# Patient Record
Sex: Female | Born: 1980 | Race: Black or African American | Hispanic: No | Marital: Single
Health system: Midwestern US, Community
[De-identification: ages and names within clinical notes are randomized; demographics above are authoritative.]

## PROBLEM LIST (undated history)

## (undated) ENCOUNTER — Inpatient Hospital Stay: Payer: Self-pay

## (undated) DIAGNOSIS — F319 Bipolar disorder, unspecified: Secondary | ICD-10-CM

## (undated) DIAGNOSIS — F23 Brief psychotic disorder: Secondary | ICD-10-CM

---

## 2012-06-21 ENCOUNTER — Inpatient Hospital Stay
Admit: 2012-06-21 | Discharge: 2012-06-26 | Disposition: A | Discharge status: Home Health | Payer: MEDICARE | Attending: Addiction Medicine | Admitting: Addiction Medicine

## 2012-06-21 DIAGNOSIS — F29 Unspecified psychosis not due to a substance or known physiological condition: Secondary | ICD-10-CM

## 2012-06-21 LAB — METABOLIC PANEL, COMPREHENSIVE
A-G Ratio: 0.9 — ABNORMAL LOW (ref 1.0–3.1)
ALT (SGPT): 58 U/L (ref 12.0–78.0)
AST (SGOT): 39 U/L — ABNORMAL HIGH (ref 15–37)
Albumin: 4.2 g/dL (ref 3.40–5.00)
Alk. phosphatase: 95 U/L (ref 50–136)
Anion gap: 19 mmol/L — ABNORMAL HIGH (ref 10–17)
BUN/Creatinine ratio: 16 (ref 6.0–20.0)
BUN: 11 MG/DL (ref 7–18)
Bilirubin, total: 0.7 MG/DL (ref 0.20–1.00)
CO2: 23 mmol/L (ref 21–32)
Calcium: 9.3 MG/DL (ref 8.5–10.1)
Chloride: 100 mmol/L (ref 98–107)
Creatinine: 0.7 MG/DL (ref 0.6–1.3)
GFR est AA: >60 mL/min/{1.73_m2} (ref >60)
GFR est non-AA: >60 mL/min/{1.73_m2} (ref >60)
Globulin: 4.7 g/dL
Glucose: 76 mg/dL (ref 74–106)
Potassium: 4.3 mmol/L (ref 3.50–5.10)
Protein, total: 8.9 g/dL — ABNORMAL HIGH (ref 6.40–8.20)
Sodium: 138 mmol/L (ref 136–145)

## 2012-06-21 LAB — CBC WITH AUTOMATED DIFF
ABS. BASOPHILS: 0 10*3/uL (ref 0.0–0.2)
ABS. EOSINOPHILS: 0.1 10*3/uL (ref 0.0–0.7)
ABS. LYMPHOCYTES: 2 10*3/uL (ref 1.2–3.4)
ABS. MONOCYTES: 0.7 10*3/uL — ABNORMAL LOW (ref 1.1–3.2)
ABS. NEUTROPHILS: 5.4 10*3/uL (ref 1.4–6.5)
BASOPHILS: 0 % (ref 0–2)
EOSINOPHILS: 2 % (ref 0–5)
HCT: 41 % (ref 34.0–42.2)
HGB: 14 g/dL (ref 11.8–14.2)
IMMATURE GRANULOCYTES: 0.4 % (ref 0.0–5.0)
LYMPHOCYTES: 24 % (ref 16–40)
MCH: 28.9 PG (ref 27–31)
MCHC: 34.1 g/dL (ref 32–36)
MCV: 84.7 FL (ref 81–99)
MONOCYTES: 8 % (ref 0–12)
MPV: 10 FL (ref 7.4–10.4)
NEUTROPHILS: 66 % (ref 40–70)
PLATELET: 270 10*3/uL (ref 140–450)
RBC: 4.84 M/uL (ref 4.0–5.2)
RDW: 14.7 % — ABNORMAL HIGH (ref 11.5–14.5)
WBC: 8.2 10*3/uL (ref 4.8–10.8)

## 2012-06-21 LAB — ETHYL ALCOHOL: ALCOHOL(ETHYL),SERUM: <3 MG/DL (ref <3.0)

## 2012-06-21 MED ADMIN — ziprasidone (GEODON) 20 mg/mL (final conc.) injection: INTRAMUSCULAR | @ 18:00:00 | NDC 00049392083

## 2012-06-21 MED ADMIN — sterile water (preservative free) injection: @ 18:00:00 | NDC 00409488710

## 2012-06-21 MED ADMIN — LORazepam (ATIVAN) 2 mg/mL injection: INTRAMUSCULAR | @ 18:00:00 | NDC 00641604425

## 2012-06-21 NOTE — ED Notes (Signed)
Assumed care of pt. Pt brought to ED after found yelling and walking around with her children barefoot. Pt is awake and alert. Pt not verbally responsive. Identity not known. Pt not cooperative with EDT to being processing. Pt standing making a moaning noise with hands in air, facing the wall. MD made aware.

## 2012-06-21 NOTE — ED Notes (Signed)
Pt took off all of her clothes in the bathroom and was standing in corner reading the bible. Pt would not speak or acknowledge RN. Consulting civil engineer and EDT assisted pt to re-dress and back to bed. Psych SW at bedside.

## 2012-06-21 NOTE — ED Notes (Signed)
RN requested urine sample from pt. Pt ambulated to restroom and provided sample. Pt to be admitted. Awaiting bed assignment.

## 2012-06-21 NOTE — ED Notes (Signed)
Pt sleeping at this time. Sitter at bedside. Will continue to monitor.

## 2012-06-21 NOTE — ED Notes (Signed)
Brought in by police,EP was yelling  And walking barefooted with her 3 children.Refused to answer questions. Talking to self and drooling.

## 2012-06-21 NOTE — Other (Signed)
TRANSFER - IN REPORT:    Verbal report received from [facility], RN(name) on [employer]  being received from ED(unit) for routine progression of care      Report consisted of patient???s Situation, Background, Assessment and   Recommendations(SBAR).     Information from the following report(s) SBAR, Kardex, ED Summary, [facility] and Recent Results was reviewed with the receiving nurse.    Opportunity for questions and clarification was provided.      Assessment completed upon patient???s arrival to unit and care assumed.

## 2012-06-21 NOTE — Other (Signed)
TRANSFER - OUT REPORT:    Verbal report given to Ethel(name) on Heather Huffman  being transferred to [facility] (unit) for routine progression of care       Report consisted of patient???s Situation, Background, Assessment and   Recommendations(SBAR).     Information from the following report(s) SBAR, ED Summary, [facility] and Recent Results was reviewed with the receiving nurse.    Opportunity for questions and clarification was provided.

## 2012-06-21 NOTE — ED Notes (Signed)
Pt began crying when MD and security undressed pt. Pt cooperative after being medicated. Awaiting lab results. Will continue to monitor.

## 2012-06-21 NOTE — ED Provider Notes (Signed)
Patient was seen by Psych Child psychotherapist. She was able to give her name and her children's name but refuse to answer other questions. She will be admitted for psychiatric stabilization. Involuntary papers signed.  Johnna Acosta, MD

## 2012-06-21 NOTE — ED Notes (Signed)
Pt sleeping. Pt arousable to touch. Pt still will not give name.

## 2012-06-21 NOTE — Progress Notes (Signed)
Curt Bears. with Value options approved patient to be admitted for 5 days-- 4/23, 4/24,4/25,4/26,4/27 with review on 4/28.  Auth# [id number]

## 2012-06-21 NOTE — Progress Notes (Signed)
Pt is a 32 year old single african [place] female who was brought to the ER by [facility] on an emergency petition. Reportedly patient was found walking in and out of traffic without shoes with her three young children, ages 69,7, and 3. Since in the er, patient has basically been uncooperative and refusing to answer any questions.Pt had been listed as a Stage manager.    Reportedly,while  being processed pt stood with her hands raised facing a wall and talking to her self and moaning.  Also, pt found in the bathroom naked standing in the corner reading her bible.  Initially, pt refused to talk to social worker, but instead sat mumbling and turned head.  Later, pt asked to speak to social worker asking if I knew where her children are.  Patient informed that I need to know her name and the name and ages of the children in order to attempt to locate them.  Patient gave the names of her children as being [place] Puth-9 yrs old, Lazaro Arms- 30 years old, and Enrigue Catena -30 years old.  Pt refused any further discussion and turned and started reading her Bible. Social worker unable to assess S/I,HI as sits with a blank stare. Pt does appear to be responding to internal stimuli as she is not focusing, mumbling to her self.      Axis I: Psychotic D/O Nos  Axis II: deferred  Axis III: Unknown  Axis IV: Unknown  Axis V:20    Patient discussed with Dr Freida Busman who recommended an involuntary inpatient psychiatric admission

## 2012-06-21 NOTE — ED Notes (Signed)
Pt requesting PO fluids. Pt ambulated to restroom and gave urine sample without distress. Pt did not respond when RN requested her name. Pt asked if she could go home after she gave urine, RN advised she needs to be evaluated by psych social worker.

## 2012-06-21 NOTE — ED Notes (Signed)
Sitter advises pt sat up on bed and urinated on herself. Sitter changed sheets.

## 2012-06-21 NOTE — ED Provider Notes (Signed)
HPI Comments: The patient was an emergency petition. The pt was walking in the street barefoot with two small children. She was walking into traffic and her behaviour was not appropriate.  She would not answer questions and she was brought to the hospital by police for further evaluation.    Patient is a 32 y.o. female presenting with mental health disorder. The history is provided by the police. The history is limited by the condition of the patient.   Mental Health Problem   Associated symptoms include unresponsiveness. Pertinent negatives include no confusion.     Past medical history - unable to obtain    Medications - unable to obtain    Social history- The pt was not willing to give a social history    History reviewed. No pertinent past medical history.     History reviewed. No pertinent past surgical history.      History reviewed. No pertinent family history.     History     Social History   ??? Marital Status: SINGLE     Spouse Name: N/A     Number of Children: N/A   ??? Years of Education: N/A     Occupational History   ??? Not on file.     Social History Main Topics   ??? Smoking status: Unknown If Ever Smoked   ??? Smokeless tobacco: Never Used   ??? Alcohol Use: Not on file   ??? Drug Use: Not on file   ??? Sexually Active: Not on file     Other Topics Concern   ??? Not on file     Social History Narrative   ??? No narrative on file                  ALLERGIES: Review of patient's allergies indicates not on file.      Review of Systems   Unable to perform ROS: Psychiatric disorder   Psychiatric/Behavioral: Negative for confusion.       Filed Vitals:    06/21/12 1337 06/21/12 1341   BP: 115/89    Pulse: 89    Temp: 97 ??F (36.1 ??C)    Resp: 20    Height:  5\' 4"  (1.626 m)   Weight:  72.576 kg (160 lb)   SpO2: 99%             Physical Exam   Nursing note and vitals reviewed.  Constitutional: She appears well-developed and well-nourished.   HENT:   Head: Normocephalic and atraumatic.   Eyes: Conjunctivae are normal. Pupils are  equal, round, and reactive to light.   Neck: Normal range of motion. Neck supple.   Cardiovascular: Normal rate and regular rhythm.    Pulmonary/Chest: Effort normal and breath sounds normal.   Abdominal: Soft. Bowel sounds are normal.   Neurological: She is alert.   Pt selectively talking.   She requested something to drink and that she did not want to take her shirt off.  Unable to get her name or orientation.  Pt moaning.   Skin: Skin is warm and dry.   Psychiatric:   Pt moaning not answering questions.   Pt  had to be sedated for agitated behaviior        MDM     Differential Diagnosis; Clinical Impression; Plan:     A young appearing female with bizaare behavior that is most likely related to a psychiatric disorder      Plan:     Labs  Psych  evaluation              Procedures    4:35 PM  The pt is arousable but very sleepy at this time.

## 2012-06-22 LAB — URINALYSIS W/ RFLX MICROSCOPIC
Bilirubin: NEGATIVE
Glucose: NEGATIVE mg/dL
Ketone: 15 mg/dL — AB
Leukocyte Esterase: NEGATIVE
Nitrites: NEGATIVE
Protein: NEGATIVE mg/dL
Specific gravity: 1.01 (ref 1.003–1.030)
Urobilinogen: 0.2 EU/dL (ref 0.2–1.0)
pH (UA): 6 (ref 5.0–7.0)

## 2012-06-22 LAB — DRUG SCREEN, URINE
AMPHETAMINES: NEGATIVE
BARBITURATES: NEGATIVE
BENZODIAZEPINES: NEGATIVE
COCAINE: NEGATIVE
METHADONE: NEGATIVE
OPIATES: NEGATIVE
PCP(PHENCYCLIDINE): NEGATIVE
THC (TH-CANNABINOL): NEGATIVE
TRICYCLICS: NEGATIVE

## 2012-06-22 LAB — URINE MICROSCOPIC
Casts: NONE SEEN /lpf
Crystals, urine: NONE SEEN /LPF
RBC: 0 /hpf
WBC: 0 /hpf

## 2012-06-22 MED ADMIN — diphenhydrAMINE (BENADRYL) injection 50 mg: INTRAMUSCULAR | @ 08:00:00 | NDC 63323066401

## 2012-06-22 MED ADMIN — ziprasidone (GEODON) 20 mg in sterile water (preservative free) injection: INTRAMUSCULAR | @ 08:00:00 | NDC 00409488710

## 2012-06-22 NOTE — H&P (Signed)
INFORMATION SOURCE: Chart review, patient interview.    CHIEF COMPLAINT: "I'm okay, it's all right."    HISTORY OF PRESENT ILLNESS: The patient is a 32 year old single African  American female who presented to [facility] emergency department  on emergency petition, transported by [facility] police. Reportedly,  the patient was found walking in and out of traffic without shoes,  accompanied by her 3 childrens, ages 70, 64 and 3. While in the emergency  department, the patient was basically uncooperative and refusing to answer  questions. In the emergency department, the patient was listed as Heather Huffman.  Reportedly while being processed, the patient stood with her hands raised,  facing a wall and talking to herself and moaning. The patient was also  found in a bathroom naked, standing in the corner, reading from a Bible.  Initially the patient refused or was unwilling to talk to the ED social  worker, but instead sat mumbling and turned her head. Later, the patient  asked to speak to social worker, asking if she knew where her children  were. The patient gave the names of her children and ages. The patient  refused to have any further discussion and returned to her Bible. Social  worker was unable to assess the patient for suicidal or homicidal  ideations. The patient proceeded with a blank stare. The patient appeared  to be responding to internal stimuli, as she did not focus and was mumbling  to herself. At this writer's attempt to interview the patient, the patient  was able to communicate. 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 She said that she was  worried but repeated feeling fine and needed some rest. The patient denied  not seem concerned about the location of her children, and had difficulty  giving the names of relatives, their phone numbers or addresses in order  to  secure collateral information. When questioned, the patient denied suicidal  or homicidal ideations, and the patient denied hearing voices.    PAST PSYCHIATRIC HISTORY: The patient was reported a self diagnosis of  depression which started at age 41. Inpatient psychiatric stays at  [facility], "I don't remember when." No current  outpatient provider.    PAST MEDICAL HISTORY: The patient denies. Last physical in 2013.    ALLERGIES: AMOXICILLIN.    MEDICATIONS: None current.    FAMILY PSYCHIATRIC HISTORY: The patient reports depression.    SOCIAL HISTORY: The patient resides in house with 3 children, ages 23, 61 and  3. They are currently in the custody of child protective services. The  patient has a 12th grade education.    MENTAL STATUS EXAMINATION: On admission, the patient's appearance is  unkempt, tense, and rigid. The patient's behavior is agitated, guarded,  poor eye contact, and occasionally restless. The patient is not  forthcoming. The patient is oriented to time, place and person and  situation. The patient's speech is impoverished, slow, and monotone. The  patient's mood is withdrawn, sad and frightened. The patient's range of  affect is constricted and inappropriate. The patient's thought content is  paranoid. Thought process is blocking. Thought perception appears  responding to internal stimuli. The patient's memory is significantly  impaired, and/or selective. The patient's judgment and insight are poor.    ASSESSMENT  Axis I: Psychotic disorder, not otherwise specified; depressive disorder,  not otherwise specified.  Axis II: Deferred.  Axis III: None acute.  Axis IV: Child custody issues, questionable housing issues, questionable  legal issues.  Axis V: 35.    PLAN: Admit to [facility]. Obtain collateral information. Continue to  monitor psychotic and affective symptoms. Somatic evaluation. Constant  observation. Continue to develop rapport. Consider antipsychotic trial,   with discussion of risk/benefit profile and potential side effects profile.  Begin disposition planning.        Reather Littler MD        Dictated by: Mallory Shirk Shevelle Smither]  Job#: [[id number]]  DD: [06/22/2012 08:24 P]  DT: [06/22/2012 08:40 P]  Transcribed by: [wmx]    cc:            Saumya Hukill,MD Freida Busman

## 2012-06-22 NOTE — Behavioral Health Treatment Team (Signed)
Patient currently resting quietly in quiet room. PRN medication effective. Will continue to monitor.

## 2012-06-22 NOTE — Progress Notes (Signed)
Pt in  quiet room this morning, laying on mattress.  Selectively mute, no response to  Attempted engagement

## 2012-06-22 NOTE — Behavioral Health Treatment Team (Signed)
Patient overheard crying and moaning loudly in bedroom. Upon getting to patient's room, this nurse found patient standing naked in bedroom, facing the wall, with both hands raised. Despite much encouragement, nurse was unable to verbally redirect or calm patient down. Patient was medicated with PRN Geodon and Benadryl as ordered. Patient continues to be selectively mute. Will continue to monitor.

## 2012-06-22 NOTE — Other (Signed)
Recorded shift change report given  Hilary Ottley, RN   (offgoing nurse).  Report given with SBAR.

## 2012-06-22 NOTE — Behavioral Health Treatment Team (Signed)
Pt observed in bedroom sitting up in bed with no clothes on. Pt reading bible and crying. Pt is difficult to engage as pt continues to be selectively mute. Pt appears to be thought blocking and frequently looks at nurse with a blank stare saying nothing. Pt only states "I am praying, there is nothing else going on." Dr Freida Busman made aware. Will continue to monitor as needed.

## 2012-06-22 NOTE — Progress Notes (Signed)
"  i'm ok" was the response of the client when asked if she remembered why she was here.  Clients' affect is blunted, mood is constricted. Appears to responding to internal stimuli. Also exhibiting some thought blocking.  Pt is also preoccupied on "do you have an extra food tray? Patient stated that she just wanted to get into her bed.  She has been visible in the dayroom watching TV. Withdrawn to self.  Interacting with staff. No peer interaction noted.  Pt showered and erfromed adl's, independently.  Will continue to monitor for psychosis,agitation ,bizare behavior.

## 2012-06-22 NOTE — Other (Signed)
Recorded shift change report given by ETHEL  NKWANYUO   (offgoing nurse).  Report given with SBAR, Kardex and MAR.

## 2012-06-22 NOTE — H&P (Signed)
dictated

## 2012-06-23 MED ADMIN — diphenhydrAMINE (BENADRYL) injection 50 mg: INTRAMUSCULAR | @ 15:00:00 | NDC 63323066401

## 2012-06-23 MED ADMIN — ziprasidone (GEODON) 20 mg in sterile water (preservative free) injection: INTRAMUSCULAR | @ 15:00:00 | NDC 00409488710

## 2012-06-23 NOTE — Progress Notes (Signed)
06/22/12 1950   Attitude and Behavior   General Attitude Guarded   Affect Constricted   Mood Anxious   Insight Poor   Judgement Impaired   Memory  Intact   Thought Content Preoccupation   Hallucinations None   Delusions Paranoid   Concentration Poor   Speech Pattern Unremarkable   Thought Process Thought blocking   Motor Activity Unremarkable   Pt was met in her bedroom sitting up in bed when care was first assumed. Pt was guarded with information when nurse was trying to establish rapport. Pt was preoccupied with taking a shower as she asked nurse numerous times after been told shower time was between 9:30- 10pm. Pt was withdrawn to self and bedroom. She's weird and strange in behavior. She stares at staff periodically and when checked upon at 4:00 a.m., she has about 50 paper towels used by her bedside floor. She claimed she used them to wipe her hands. Pt denies SI/HI/any hallucinations.

## 2012-06-23 NOTE — Progress Notes (Addendum)
.  Client in room with door closed. Upon opening the door, this nurse observed piles of paper towel on top of the nightstand.  A sheet at the foot of the door and  the client standing up staring at the door.  Pt  appeared  to be responding to Internal stimuli.( turning head to one side occasionally and grimacing).  Pt Increasingly became agitated; impulsively jumping and moving as if she is startled when this nurse moved closer to her.  Despite verbal attempts to assure patient of safety patient remained paranoid and increasingly agitated. Pt was given geodon 20mg  and benadryl 50mg  as ordered. Given in left detoid Im. No hands on necessary, no injury oted.

## 2012-06-23 NOTE — Consults (Signed)
DATE OF CONSULTATION: 06/23/2012    REQUESTING PHYSICIAN: Reather Littler, MD    REASON FOR CONSULTATION: Medical clearance.    HISTORY OF PRESENT ILLNESS: This is a 32 year old female who was brought  into the emergency room by [facility] on an emergency petition.  The patient was found walking in and out of traffic without shoes with her  3 young children, aged 43, 7, and 3, and admitted to the psychiatric unit  for psychosis. The patient was seen and examined. She is awake, alert,  oriented, cooperative. She states that she feels fine right now. She stated  that she has history of bipolar disorder and had been taking some  medication in the past, however, does not remember her medication. She  stated that she follows at [facility]. She has history of asthma and  has been taking albuterol inhaler in the past. Denies any cough, shortness  of breath, wheezing or chest tightness at this point. No fever or chills.  No nausea or vomiting. No urinary or bowel complaint. No chest pain or  palpitation.    PAST MEDICAL HISTORY:  1. Bipolar disorder.  2. Psychosis.  3. Asthma.    ALLERGIES: NO KNOWN DRUG ALLERGIES.    CURRENT MEDICATIONS: Include  1. Benadryl 50 mg every 4 hours as needed.  2. Trazodone 50 mg at bedtime.  3. Geodon 20 mg every 4 hours as needed.    FAMILY HISTORY: Noncontributory.    SOCIAL HISTORY: The patient is single. She has 3 children. Denies use of  cigarettes, alcohol, or illicit drug use. Currently unemployed.    REVIEW OF SYSTEMS: All 10 systems are reviewed as per HPI, otherwise  negative.    PHYSICAL EXAMINATION  GENERAL: She is lying in bed in no sign of distress.  VITAL SIGNS: Blood pressure 119/81, heart rate of 110, respiratory rate 18,  temperature of 99.0, pulse oximetry 100%.  HEENT: Head is atraumatic, normocephalic. Ears: No discharge or ulceration.  Eyes: Pupils are equal, round, reactive to light and accommodation. Nose:  Septum not deviated, no lesion. Oropharynx is  clear. No lesion.  NECK: Supple. No thyroid enlargement.  RESPIRATORY: Bilateral air entry. No additional sound.  CARDIOVASCULAR: S1 and S2 were heard.  GASTROINTESTINAL: Abdomen is soft, no mass, tenderness or organomegaly.  Active bowel sounds.  MUSCULOSKELETAL: No joint deformity, swelling or tenderness.  SKIN: Intact, no lesion.  NEUROLOGIC: She is awake, alert, oriented x3. No sensory, motor or reflex  abnormalities.    LABORATORY DATA: Sodium 138, potassium 4.3, chloride 100, bicarbonate 23,  anion gap of 19, glucose 26, BUN of 11, creatinine 0.70, calcium 9.3. WBC  count 8.3, hemoglobin 14.0, hematocrit 41.0, platelets 270. Urinalysis is  unremarkable. Urine toxicology is negative.    ASSESSMENT:  1. Bizarre behavior, rule out psychosis.  2. Bipolar disorder.  3. Asthma.    PLAN: With regard to her psychiatric conditions including bipolar disorder,  psychosis and bizzare behavior, she is being managed by psychiatric team  and currently she is on Geodon 20 mg every 4 hours as needed, Benadryl 50  mg every 4 hours as needed, and trazodone 50 mg by mouth at bedtime. She  has history of asthma and her asthma is stable. In the meantime, I will  start patient with albuterol inhaler 2 puffs every 6 hours as needed.        Electronically viewed and signed by Illene Labrador, MD on 06/23/2012 07:34  P.  Dictated byIllene Labrador  Job#: [[id number]]  DD: [06/23/2012 10:44 A]  DT: [06/23/2012 11:56 A]  Transcribed by: [wmx]    cc:            Rhonda,MD Cathie Hoops

## 2012-06-23 NOTE — Progress Notes (Signed)
PSYCHIATRY INPATIENT PROGRESS NOTE    SUBJECTIVE: Pt presents in hospital attire . She is mildly dishelved and obese.  She makes poor eye contact and is soft spoken.  She ins soft spoken and speech is improvished.  She appears RIS.  Her eye contact is poor.  She at times appears despondent.  When questioned about where her children are or any support contacts she is evasive and offers no information or affective change.  Pt denies any pain or discomfort.  She did ask to be discharge on Tuesday ,but gave no discharge location.  I prompted her with a trial of medication to clear her thinking and she refused.  Pt denies SI, HI or AH.    Current facility-administered medications:albuterol (PROVENTIL VENTOLIN) nebulizer solution 2.5 mg, 2.5 mg, Nebulization, Q6H PRN, Reather Littler, MD;  ziprasidone (GEODON) 20 mg in sterile water (preservative free) injection, 20 mg, IntraMUSCular, Q4H PRN, Reather Littler, MD, 20 mg at 06/22/12 0413;  diphenhydrAMINE (BENADRYL) injection 50 mg, 50 mg, IntraMUSCular, Q4H PRN, Reather Littler, MD, 50 mg at 06/22/12 0413  traZODone (DESYREL) tablet 50 mg, 50 mg, Oral, QHS PRN, Reather Littler, MD      No Known Allergies      Mental Status Exam  The patient's appearance is unkempt.  The patient's behavior shows retardation and shows poor eye contact. The patient is oriented to time, place, person and situation.  The patient's speech is slowed, is soft and is impoverished.  The patient's mood  is depressed, is withdrawn, is sad and is frightened.  The range of affect is constricted.  The patient's thought content  demonstrates paranoia.  The thought process is blocking.  The patient's perception demonstrated changes in the following:  Auditory, appears RIS. The patient's memory is impaired.  The patient's appetite shows no evidence of impairment.  The patient's sleep shows no evidence of impairment. The patient shows no insight.  The patient's judgement is psychologically impaired and is cognitively  impaired.      Labs reviewed.    Blood pressure 126/86, pulse 96, temperature 98.7 ??F (37.1 ??C), resp. rate 19, height 5\' 4"  (1.626 m), weight 100 kg (220 lb 7.4 oz), last menstrual period 05/22/2012, SpO2 100.00%.    IMPRESSION AND PLAN:   Active Hospital Problems    Diagnosis Date Noted   ??? Psychosis 06/22/2012       1. Continue inpatient psychiatric hospitalization on routine precautions.  2. Pt refuses med trial, continue to build rapport.  3. Continue discharge planning. Obtain collateral info from CPS    Reather Littler, MD   06/23/2012 8:27 PM

## 2012-06-23 NOTE — Progress Notes (Signed)
PRN effective. Patient is calm, speaking and responding to writer verbally from time to time. Pt remained in room for the early part of the afternoon.visible for meals only., no peer interaction. Will continue to monitor and provide comfort. Q 15 minute safety checks continues.

## 2012-06-23 NOTE — Other (Signed)
Recorded shift change report given by MOJISOLA A. SOFOLUKE   (offgoing nurse).  Report given with SBAR, Kardex and MAR.

## 2012-06-23 NOTE — Other (Signed)
Patient did not participate in community meeting.In room standing appears to be preoccupied.

## 2012-06-24 MED ADMIN — diphenhydrAMINE (BENADRYL) injection 50 mg: INTRAMUSCULAR | @ 22:00:00 | NDC 63323066401

## 2012-06-24 MED ADMIN — ziprasidone (GEODON) 20 mg in sterile water (preservative free) injection: INTRAMUSCULAR | @ 14:00:00 | NDC 00409488710

## 2012-06-24 MED ADMIN — diphenhydrAMINE (BENADRYL) injection 50 mg: INTRAMUSCULAR | @ 14:00:00 | NDC 63323066401

## 2012-06-24 MED ADMIN — traZODone (DESYREL) tablet 50 mg: ORAL | @ 03:00:00 | NDC 00904399061

## 2012-06-24 MED ADMIN — traZODone (DESYREL) tablet 50 mg: ORAL | @ 04:00:00 | NDC 00904399061

## 2012-06-24 MED ADMIN — ziprasidone (GEODON) 20 mg in sterile water (preservative free) injection: INTRAMUSCULAR | @ 22:00:00 | NDC 00409488710

## 2012-06-24 NOTE — Progress Notes (Signed)
PSYCHIATRY INPATIENT PROGRESS NOTE    SUBJECTIVE:Pt presents pacing hallway holding bible and malodorous. Pt remains eager to leave.  When question what she will do upon discharge she turns to read her bible.  When not reading Pt appears RIS.  She refuses any med trials. Pt reports sleep, appetite and energy and good. " I am doing good in Jesus name".  She says she will locate her children and does not need any help, when asked where she will start, her reply is, " from the beginning".        Current facility-administered medications:albuterol (PROVENTIL VENTOLIN) nebulizer solution 2.5 mg, 2.5 mg, Nebulization, Q6H PRN, Reather Littler, MD;  ziprasidone (GEODON) 20 mg in sterile water (preservative free) injection, 20 mg, IntraMUSCular, Q4H PRN, Reather Littler, MD, 20 mg at 06/23/12 1036;  diphenhydrAMINE (BENADRYL) injection 50 mg, 50 mg, IntraMUSCular, Q4H PRN, Reather Littler, MD, 50 mg at 06/23/12 1035  traZODone (DESYREL) tablet 50 mg, 50 mg, Oral, QHS PRN, Reather Littler, MD, 50 mg at 06/24/12 0017      No Known Allergies      Mental Status Exam  The patient's appearance is unkempt, shows poor hygiene, is bizarre and is tense.  The patient's behavior is agitated, show psychomotor dysfunction, shows poor eye contact and is restless. The patient is only aware of  time, place and person.  The patient's speech is soft and is impoverished.  The patient's mood  is depressed, is withdrawn, is sad and is frightened.  The range of affect is constricted and is innappropriate.  The patient's thought content  demonstrates delusions and paranoia.  The thought process is blocking.  The patient's perception demonstrated changes in the following:  auditory  Hallucinations, RIS. The patient's memory is impaired.  The patient's appetite shows no evidence of impairment.  The patient's sleep shows no evidence of impairment. The patient shows no insight.  The patient's judgement is psychologically impaired.      Labs reviewed.     IMPRESSION AND PLAN:   1. Continue inpatient psychiatric hospitalization on routine precautions. continue to build rapport.  2. Continue discharge planning.     Reather Littler, MD   06/24/2012 9:36 AM

## 2012-06-24 NOTE — Other (Signed)
Patient medicated with Geodon 20 mg,IM at 1748 and Diphenhydramine 50 mg, IM at 1747 for psychosis; patient in bedroom naked, not following directives, and repeating the same scriptures over and over while facing the wall. Pending effectiveness.

## 2012-06-24 NOTE — Progress Notes (Signed)
Problem: Psychosis  Goal: *STG: Decreased hallucinations  Outcome: Not Progressing Towards Goal  Pt in room responding to internal stimuli.  Goal: *STG: Participates in individual and group therapy  Outcome: Not Progressing Towards Goal  Pt isolative to room.

## 2012-06-24 NOTE — Other (Signed)
Recorded shift change report given by MOJISOLA A. SOFOLUKE   (offgoing nurse).  Report given with SBAR, Kardex and MAR.

## 2012-06-24 NOTE — Other (Signed)
Recorded shift change report given by SHARON D SMITH, RN  offgoing nurse).  Report given with SBAR.

## 2012-06-24 NOTE — Other (Signed)
Alert and oriented to her name, has constricted affect.  Self care, but unkempt and malodorous.  Withdrawn to self, does not relate to others, patient refused redirection from staff when she came out of her room naked several times.  She tried to push past staff to continue into the milieu while she was naked.  Received Geodon 20 mg, IM and Diphenhydramine 50 mg IM at 10:16 am, effective.  Patient would not stay in her bedroom and would not cover up her nakedness; she was offered the quiet room, which she accepted.  She paced around in the quiet room, until she became calm and sleepy. She kept repeating aloud while she was getting the IMs, " Thank you Jesus. Forgive them for they not know what they are doing."  During am rounds, she did not respond when this Clinical research associate greeted her. She stood in the middle of her bedroom floor holding and reading her Bible.  1:1 verbal interaction and safety maintained.

## 2012-06-24 NOTE — Progress Notes (Signed)
06/23/12 2005   Attitude and Behavior   General Attitude Guarded   Affect Constricted   Mood Other (Comment)   Insight Poor   Judgement Impaired   Memory  Intact   Thought Content Preoccupation   Hallucinations None   Delusions Paranoid   Concentration Poor   Speech Pattern Mute   Thought Process Poverty of thought   Motor Activity Unremarkable   Pt's bedroom door closed. Upon a knock and entrance, met pt sitting in the chair counting her fingers. When greeted,she was mute and made a poor eye contact. Nurse attempted rapport so that she can talk but pt just said "I am fine right now" Pile of used paper towels found on the nightstand and a bedsheet on the floor behind the door. Pt made aware not to close bedroom door but to crack it. Pt remains in chair wide awake each hour round was made. Trazodone 50 mg PRN for sleep offered at 2257 and 0016. Pt took the PRN's but appeared ineffective. Pt came to the milieu to get water 3-4 times between 11: pm- 4: am. Other than that she was withdrawn to her bedroom. Pt appeared sad and depressed. She also appeared to be definitely responding to an internal stimuli.

## 2012-06-25 MED ADMIN — diphenhydrAMINE (BENADRYL) injection 50 mg: INTRAMUSCULAR | @ 14:00:00 | NDC 63323066401

## 2012-06-25 MED ADMIN — diphenhydrAMINE (BENADRYL) injection 50 mg: INTRAMUSCULAR | @ 23:00:00 | NDC 63323066401

## 2012-06-25 MED ADMIN — ziprasidone (GEODON) 20 mg in sterile water (preservative free) injection: INTRAMUSCULAR | @ 14:00:00 | NDC 00409488710

## 2012-06-25 MED ADMIN — ziprasidone (GEODON) 20 mg in sterile water (preservative free) injection: INTRAMUSCULAR | @ 23:00:00 | NDC 00409488710

## 2012-06-25 NOTE — Other (Incomplete)
Patient is alert, but does not relate to staff interaction, displaying psychotic behavior.  Refuses to follow directives. Standing against the wall in bedroom naked, praying loudly, crying, and reading the Bible.  Refused all treatment, vital signs and food.  Food and fluids offered to her, but refuses.  Received Geodon 20 mg, IM and diphenhydramine 50 mg, IM at

## 2012-06-25 NOTE — Other (Signed)
Refused vitals @this  time.

## 2012-06-25 NOTE — Progress Notes (Signed)
PSYCHIATRY INPATIENT PROGRESS NOTE    SUBJECTIVE: Pt rounded on earlier today and she presented with sever psychosis and lability.  Pt was not re-directable RIS and difficult to engage.  Her judgement and  insight are poor.  Noted offered po meds refused, given IM meds for severe agitation to prevent harm to self and others. Will attempt to gain collateral info from CPS in am.        Current facility-administered medications:[COMPLETED] haloperidol lactate (HALDOL) injection 10 mg, 10 mg, IntraMUSCular, NOW, Reather Littler, MD, 10 mg at 06/25/12 2056;  [COMPLETED] diphenhydrAMINE (BENADRYL) injection 25 mg, 25 mg, IntraMUSCular, NOW, Reather Littler, MD, 25 mg at 06/25/12 2057;  albuterol (PROVENTIL VENTOLIN) nebulizer solution 2.5 mg, 2.5 mg, Nebulization, Q6H PRN, Reather Littler, MD  diphenhydrAMINE (BENADRYL) injection 50 mg, 50 mg, IntraMUSCular, Q4H PRN, Reather Littler, MD, 50 mg at 06/25/12 1903;  traZODone (DESYREL) tablet 50 mg, 50 mg, Oral, QHS PRN, Reather Littler, MD, 50 mg at 06/24/12 0017      No Known Allergies      Mental Status Exam  The patient's appearance is unkempt, shows poor hygiene and is bizarre.  The patient's behavior is agitated, is manic , shows poor impulse control, shows poor eye contact and is restless. The patient is oriented to time, place, person and situation.  The patient's speech is pressured and is loud.  The patient's mood  is hostile, is irritable and is expansive.  The range of affect is labile and is inappropriate.  The patient's thought content  demonstrates delusions, grandiosity, ideas of reference and paranoia.  The thought process shows loose associations and shows a flight of ideas.  The patient's perception demonstrated changes in the following:  auditory  hallucinations, religious delusions. The patient's memory is impaired.  The patient's appetite shows no evidence of impairment.  The patient's sleep has evidence of insomnia. The patient shows no insight.  The patient's  judgement is psychologically impaired.    .     Blood pressure 104/71, pulse 100, temperature 97.8 ??F (36.6 ??C), resp. rate 20, height 5\' 4"  (1.626 m), weight 100 kg (220 lb 7.4 oz), last menstrual period 05/22/2012, SpO2 100.00%.      IMPRESSION AND PLAN:     Active Hospital Problems    Diagnosis Date Noted   ??? Psychosis 06/22/2012       1. Continue inpatient psychiatric hospitalization on level 1 precautions  2. Continue current medications and prns  3. Medication reconciliation completed with the patient and within the EMR. Medication education supportive psychotherapy.  continue to build rapport.  4. Continue discharge planning. Obtain collateral information    Reather Littler, MD   06/25/2012 9:29 PM

## 2012-06-25 NOTE — Other (Signed)
Recorded shift change report given by SHARON D SMITH, RN  offgoing nurse).  Report given with SBAR.

## 2012-06-25 NOTE — Other (Signed)
Recorded shift change report given by NINA  POLLEY, RN   (offgoing nurse).  Report given with SBAR and Kardex.

## 2012-06-25 NOTE — Progress Notes (Signed)
Pt has been isolative to room.  She asked to shower but has not and is malodorous.  Currently, she is in her room naked, holding onto a bible and "praying" but pt appears to be responding to internal stimuli.  She denies SI, HI, denies suicide plan.  Denies aud and vis hallucinations.  Gives short, one-word answers.  Mood, labile.  Affect, flat.  Will continue q15 minute close observation.

## 2012-06-25 NOTE — Progress Notes (Signed)
Per Dr order, pt received haldol and benadry IM x 1.  She stated "Im not going in there, Im not ready."  She remains in the quiet room but after receiving the prn's came behind the nurse station twice.  Security was called and pt was escorted back to quiet room.  She was advised to remain in the quiet room and she complied.  She is reading her bible at this time.
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
## 2012-06-25 NOTE — Progress Notes (Signed)
06/24/12 2059   Attitude and Behavior   General Attitude Guarded   Affect Flat;Labile   Mood Labile   Insight Poor   Judgement Impaired   Memory  Intact   Thought Content Religiosity   Hallucinations Auditory   Delusions None   Concentration Poor   Speech Pattern Unremarkable   Thought Process Unremarkable   Motor Activity Restlessness

## 2012-06-25 NOTE — Other (Signed)
Patient refuse vital signs @this  time,continue to be religiously preoccupied.

## 2012-06-25 NOTE — Progress Notes (Signed)
PRN follow-up:  At this time, pt remains in the quiet room.  She does stand in the doorway at times and is redirectable.  She was told to stay on the mattress which she did.  She continues to respond to internal stimuli and pray.  She denies SI, HI, denies suicide plan.  Denies feeling depressed. She hesitates to answer when asked a question and her affect is blunted.  Pt does not wish to return to her room at this time.  Will continue q15 mins close observation.

## 2012-06-26 MED ADMIN — haloperidol decanoate (HALDOL DECANOATE) 50 mg/mL injection 200 mg: INTRAMUSCULAR | @ 05:00:00 | NDC 63323046901

## 2012-06-26 MED ADMIN — diphenhydrAMINE (BENADRYL) injection 25 mg: INTRAMUSCULAR | @ 01:00:00 | NDC 63323066401

## 2012-06-26 MED ADMIN — LORazepam (ATIVAN) injection 2 mg: INTRAMUSCULAR | @ 05:00:00 | NDC 00641604425

## 2012-06-26 MED ADMIN — haloperidol lactate (HALDOL) injection 10 mg: INTRAMUSCULAR | @ 01:00:00 | NDC 63323047401

## 2012-06-26 MED ADMIN — LORazepam (ATIVAN) 4 mg/mL injection: INTRAMUSCULAR | @ 05:00:00 | NDC 00641604501
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
## 2012-06-26 NOTE — Other (Signed)
Recorded shift change report given by NINA  POLLEY, RN   (offgoing nurse).  Report given with SBAR and Kardex.

## 2012-06-26 NOTE — Behavioral Health Treatment Team (Cosign Needed)
GROUP THERAPY PROGRESS NOTE    Heather Huffman is participating in Recreational Therapy.     Group time: 1.5 hour    Personal goal for participation: social    Goal orientation: relaxation    Group therapy participation: active    Therapeutic interventions reviewed and discussed:     Impression of participation: cooperative
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
## 2012-06-26 NOTE — Progress Notes (Signed)
Pt is now awake and standing at the door asking to be let out.  She has been offered a snack and beverage but declined.  She refused to allow vitals be taken.

## 2012-06-26 NOTE — Behavioral Health Treatment Team (Signed)
IP BRIEF PSYCHOSOCIAL ASSESSMENT    Service: In-patient    History:  Childhood/Family Circumstances: yes  History of Behavioral Health Issues/Treatments: yes  History of Abuse: no Type: n/a  Sexual Orientation: hetero        Economist (Indicate name & phone #):   Homeless  Explanation: homeless    Family/Significant Other Involvement: has family, but could not say specifics    Social Situation  Will patient return to admission address? No  Will patient do self care?  Yes  Support Agencies in Home: no  Cultural/Religious Ethnic Factors:no  Leisure/Recreational Activities:no  Social Peer Group:no  Haematologist of Income: ssdi                                                      Payee: self  Education Level:                                                 Military:  No  Legal Issues:  Children in custody of state.    Identified Problems: pt currently psychotic.    Interventional Plan: medication management     Dispositional Goal: Shelter    Planning Discussed With: Physician        Service Categories: Resource Explorations and Emotional Support Counseling    Service Type: Open Case    Acuity Level: 3    Ronnisha Felber, LCPC                        Extension: [phone]

## 2012-06-26 NOTE — Other (Cosign Needed)
Patient in observation room ,did not participate in community meeting.

## 2012-06-26 NOTE — Progress Notes (Signed)
Pt resting quietly in seclusion, noted rise and fall of chest.  She responds to tactile and verbal stimuli.  Will continue q15 close observation.

## 2012-06-26 NOTE — Other (Cosign Needed)
Refuse vital signs @this  time.

## 2012-09-14 NOTE — Discharge Summary (Signed)
ADMISSION DATE: 06/21/2012    DISCHARGE DATE: 06/26/2012    ATTENDING PHYSICIAN: Reather Littler, MD.    REASON FOR ADMISSION: The patient is a 32 year old single African American  female who presented to [facility] Emergency Department on an  emergency petition transported by [facility]. Reportedly, the  patient was found walking in and out of traffic without shoes, accompanied  by her 3 children, ages 66, 7 and 3. While in the emergency department, the  patient was basically uncooperative and refusing to answer questions. In  the emergency department, the patient was listed as Heather Huffman. Reportedly,  while being processed, the patient stood with her hands raised facing a  wall and talking to herself and moaning. The patient was also found in the  bathroom naked standing in the corner reading from a Bible. Initially the  patient refused or was unwilling to talk to the emergency department social  worker but instead sat mumbling and turned her head. Later, the patient  asked to speak to the social worker, asking if she knew where her children  were. For further details, please see initial psychiatric assessment done  by Dr. Freida Busman on 06/22/2012.    FINAL DIAGNOSES:  AXIS I: Psychotic disorder, not otherwise specified; depressive disorder,  not otherwise specified.  AXIS II: Deferred.  AXIS III: None acute.  AXIS IV: Child custody issues, questionable housing concerns, questionable  legal issues.  AXIS V: 35 on admission, 45-50 at discharge.    HOSPITAL COURSE: The Patient remained resistant to all forms of engagement or medication trials.  Her psychosis at times manifested as a danger to self and others requiring IM meds upon her refusal of less restrictive means of re-direction.  Patient presented at involuntary administrative hearing and was discharged, with medications and follow-up linkage  With [facility].    DISCHARGE MEDICATIONS: None.    The patient to followup with outpatient  treatment at [facility],  to see therapist, Jonna Coup, Tuesday 06/27/2012 at noon, and patient to  reside at home.        Reather Littler MD        Dictated by: Mallory Shirk Arjun Hard]  Job#: [[id number]]  DD: [09/14/2012 10:22 A]  DT: [09/14/2012 10:46 A]  Transcribed by: [wmx]    cc:            Kerrigan Gombos,MD Freida Busman

## 2017-05-29 ENCOUNTER — Inpatient Hospital Stay
Admit: 2017-05-29 | Discharge: 2017-05-30 | Disposition: A | Discharge status: Home / Self Care | Payer: MEDICARE | Attending: Pediatric Emergency Medicine

## 2017-05-29 DIAGNOSIS — O99343 Other mental disorders complicating pregnancy, third trimester: Secondary | ICD-10-CM

## 2017-05-29 NOTE — ED Notes (Signed)
PT medicated for for agitation cooperative with process. PT had been incontinent of urine, provided with clean blue pants and wet wipes for pericare. Bed cleaned and new sheet applied. PT provided with meatloaf dinner and is following directions at this time.

## 2017-05-29 NOTE — ED Triage Notes (Signed)
37 y/o female to ED as EP and accompanied by [employer] officer. Per officer, pt standing out front of church at [employer]. Pt standing in front of church since 10:30 am. Pt initially in short sleeve shirt and without shoes. Pt repeatedly ringing bell at church. Per officer, pt is a previous member of the church, and was not being allowed inside r/t previous disruptive behavior. Pt. Asked to leave and did leave but returned in 20 minutes and did same thing. On return, medics called, pt did not present danger to self or others so was not transported per medics. Pt left again but returned shortly after and stood out front church and began screaming. Pt then urinated on self. On arrival to ED, pt denies suicidal or homicidal ideation. Denies auditory or visual hallucinations. Pt is calm and cooperative. Pt reports she is 7 months pregnant. G5P4. States LMP 10/08/2016. States being followed for pregnancy by [place]. [place].

## 2017-05-29 NOTE — ED Provider Notes (Signed)
Heather Huffman is a 37 y.o. female with a history of bipolar disorder who presents to the ED via police for EP with complaint of confusion. She denies any sadness, describing herself as "just a little confused" but could not describe why. Patient claims she was standing outside her church and refusing to leave but denies any aggressive behavior. Patient was discharged last Friday from [facility] from in-patient treatment, takes 40 mg Latuda daily, and recently changed medication to Zypraxa. She has 4 children, is currently 7 months pregnant, and receives prenatal care at [facility]. No chest pain, abdominal pain, chills, sore throat, NVD, urinary or bowel problems with urination/bowels. No SI, HI, AH, or VH. Per police officer, patient was brought in for psychotic behavior refusing to leave church property, screaming, yelling, and urinating on herself.     The history is provided by the patient. No language interpreter was used.   Mental Health Problem    This is a new problem. The current episode started 12 to 24 hours ago. Associated symptoms include confusion. Pertinent negatives include no weakness, no hallucinations and no self-injury. Mental status baseline is normal.  Her past medical history does not include diabetes or hypertension.        Past Medical History:   Diagnosis Date   ??? Psychosis (HCC) 06/22/2012       History reviewed. No pertinent surgical history.      History reviewed. No pertinent family history.    Social History     Socioeconomic History   ??? Marital status: SINGLE     Spouse name: Not on file   ??? Number of children: Not on file   ??? Years of education: Not on file   ??? Highest education level: Not on file   Occupational History   ??? Not on file   Social Needs   ??? Financial resource strain: Not on file   ??? Food insecurity:     Worry: Not on file     Inability: Not on file   ??? Transportation needs:     Medical: Not on file     Non-medical: Not on file   Tobacco Use    ??? Smoking status: Former Smoker   ??? Smokeless tobacco: Never Used   Substance and Sexual Activity   ??? Alcohol use: Never     Frequency: Never   ??? Drug use: Never   ??? Sexual activity: Not on file   Lifestyle   ??? Physical activity:     Days per week: Not on file     Minutes per session: Not on file   ??? Stress: Not on file   Relationships   ??? Social connections:     Talks on phone: Not on file     Gets together: Not on file     Attends religious service: Not on file     Active member of club or organization: Not on file     Attends meetings of clubs or organizations: Not on file     Relationship status: Not on file   ??? Intimate partner violence:     Fear of current or ex partner: Not on file     Emotionally abused: Not on file     Physically abused: Not on file     Forced sexual activity: Not on file   Other Topics Concern   ??? Not on file   Social History Narrative   ??? Not on file  ALLERGIES: Latex; Amoxicillin; and Shellfish derived    Review of Systems   Constitutional: Negative for chills, fatigue and fever.   HENT: Negative for congestion, rhinorrhea, sinus pressure and sore throat.    Eyes: Negative for redness and visual disturbance.   Respiratory: Negative for cough, chest tightness and shortness of breath.    Cardiovascular: Negative for chest pain and palpitations.   Gastrointestinal: Negative for abdominal pain, blood in stool, constipation, diarrhea, nausea and vomiting.   Genitourinary: Negative for dysuria, frequency and hematuria.   Musculoskeletal: Negative for arthralgias and myalgias.   Neurological: Negative for syncope, weakness and headaches.   Psychiatric/Behavioral: Positive for confusion. Negative for hallucinations, self-injury and suicidal ideas.       There were no vitals filed for this visit.         Physical Exam   Constitutional: She is oriented to person, place, and time. She appears well-developed and well-nourished. No distress.   Gravid   HENT:    Head: Normocephalic and atraumatic.   Eyes: Pupils are equal, round, and reactive to light. Conjunctivae are normal. Right eye exhibits no discharge. Left eye exhibits no discharge. No scleral icterus.   Neck: Normal range of motion. Neck supple. No JVD present.   Cardiovascular: Normal rate, regular rhythm, normal heart sounds and intact distal pulses. Exam reveals no gallop and no friction rub.   No murmur heard.  Pulmonary/Chest: Effort normal and breath sounds normal. No respiratory distress. She has no wheezes. She has no rales.   Abdominal: Soft. Bowel sounds are normal. She exhibits no distension and no mass. There is no tenderness. There is no rebound and no guarding.   Gravid abdomen   Musculoskeletal: Normal range of motion. She exhibits no edema.   Neurological: She is alert and oriented to person, place, and time. No cranial nerve deficit. Coordination normal.   Skin: Skin is warm and dry. No rash noted. She is not diaphoretic.   Psychiatric:   Initially tearful on presentation, but not tearful upon my examination.   Nursing note and vitals reviewed.       MDM  Number of Diagnoses or Management Options  Diagnosis management comments: 37 y.o. female who is G5P4, [redacted]wks pregnant with a history of bipolar disorder presents with psychotic behavior consistent with acute bipolar exacerbation.      Plan:   IT sales professionalBehavioral Health Social Worker Consult  Behavioral Health lab profile  Admit to inpatient psych ([facility])    -----  7:47 PM  Documented by Paulino RilyJessica X Qiu, acting as a scribe for Dr. Manson PasseyBrown, Dianna Rossettieginald M, MD    PROVIDER ATTESTATION:  10:54 PM    The entirety of this note, signed by me, accurately reflects all works, treatments, procedures, and medical decision making performed by me, Koleen Distanceeginald M Anzley Dibbern, MD.              Patient Progress  Patient progress: stable       Diagnostic Studies:      Lab Data:  Labs Reviewed   METABOLIC PANEL, COMPREHENSIVE - Abnormal; Notable for the following components:        Result Value    BUN 6 (*)     Calcium 8.3 (*)     Albumin 2.7 (*)     A-G Ratio 0.6 (*)     All other components within normal limits   CBC WITH AUTOMATED DIFF - Abnormal; Notable for the following components:    MCH 26.8 (*)  RDW 15.3 (*)     ABS. MONOCYTES 0.5 (*)     All other components within normal limits   BETA HCG, QT - Abnormal; Notable for the following components:    Beta HCG, QT 27,318 (*)     All other components within normal limits   ETHYL ALCOHOL   DRUG SCREEN, URINE   HCG URINE, QL   URINALYSIS W/ RFLX MICROSCOPIC         ED Course:   8:20 PM  Per chart review, patient is [redacted] weeks pregnant, gravida 5 parida 4.    10:31 PM  Psychiatrist consultant, Dr. Trula Slade is requesting to administer Haldol 5 mg IM. Discussed reservations in pregnancy with psychiatrist. When patient was at [facility] last week, Haldol and Ativan was administered under direction of psychiatrist with an OB consult.  Plan is to give Haldol 5 mg and reassess. This is for treatment of acute psychosis and not for violent or disruptive behavior. It is not being utilized for restraint.  Pt is likely to be transferred to [facility] inpatient psychiatry.    10:56 PM    Care transferred to the oncoming provider at the end of my shift, the following items were pending at the time of sign out and discussed with the oncoming provider:    []  Labs  []  Xrays  []  Ultrasound  CT: []  Head   []  Neck   []  Chest   []  Abd/Pelvis    []  Extremity   []  C,T, or L spine  []  Other:   ---------------------------------------------------------------  [x]  Psych Eval  []  Sobriety  []  Symptomatic improvement  []  Transportation  []  Other:        Plan for disposition is:     []  Discharge   []  Likely Discharge []  Admit  []  Likely Admit  []  Pending   [x]  Transfer or Likely Transfer           []  This patient is in critical condition and was signed out at bedside; a full plan,                 dispo, and discussion of the patient was  provided to the oncoming provider.            Procedures       7:48 PM  Documented by Paulino Rily, acting as a scribe for Dr. Manson Passey, Dianna Rossetti, MD      PROVIDER ATTESTATION:  10:55 PM    The entirety of this note, signed by me, accurately reflects all works, treatments, procedures, and medical decision making performed by me, Koleen Distance, MD.

## 2017-05-29 NOTE — ED Notes (Signed)
Pt up out of bed yelling, pt with bizarre behavior, pt reorient to surround , security at bedside, charge nurse at bedside

## 2017-05-29 NOTE — Progress Notes (Signed)
This Pt was brought to the ED under an EP. The EP states that the Pt was ringing the bell of the church [place] [address] ,wering no shoes and would not leave after many attempts to ask her to leave the church property.  The Pt was brought to the ED and refuse to to cooperate by giving information to the SW . This SW found the telephone number of the Pt 's sister in a contact with [facility].  However, the sister did not answer her phone .  The Pt is talking she says to GOD  The Pt Pt is yelling and pacing in the room and wanted the rail down when she was in the bed . The Pt's behavioral is making her a danger to herself and her unborn baby .  The Pt is 36 weeks pergnent  The Pt is alert and oriented times three.  This SW talked with Dr Rosita KeaBurhanullah at 10:19 pm and he wants the Pt to be admitted  And told this SW as well as Dr Manson PasseyBrown to give the Pt 5 mg of Haldol to reduce her agitation.Knowing that Pt will need to be transfer ,this SW [facility] at 10:50pm and left information for a call back in the am/[facility] has no beds/[facility] can not [facility] this pregnant Pt/[facility] talked [place] at 10:57 pm -no beds,call in the am/[place] Talked with Helmut MusterAlicia at 10:58 pm,told to cal in the am/[facility] ,,talked with Sharia ReeveJosh at 11:00 -[employer]/[place] ,talked with Lynden Angathy at 11:02 pm and told to call in the am.  This Pt refuse to sign the vol form for admission  ,so a Involutary can be found in the Pt file in the ED.      Axis 1 F 31.9 Unspecified Bipolar and Related Disorder                                                                                                         h has no beds/

## 2017-05-29 NOTE — ED Notes (Signed)
Meal tray given, pt sitting in room  Security at bedside

## 2017-05-30 LAB — CBC WITH AUTOMATED DIFF
ABS. BASOPHILS: 0 10*3/uL (ref 0.0–0.2)
ABS. EOSINOPHILS: 0.1 10*3/uL (ref 0.0–0.7)
ABS. LYMPHOCYTES: 2.4 10*3/uL (ref 1.2–3.4)
ABS. MONOCYTES: 0.5 10*3/uL — ABNORMAL LOW (ref 1.1–3.2)
ABS. NEUTROPHILS: 5 10*3/uL (ref 1.4–6.5)
BASOPHILS: 0 % (ref 0–2)
EOSINOPHILS: 1 % (ref 0–5)
HCT: 36.8 % (ref 34.0–42.2)
HGB: 11.8 g/dL (ref 11.8–14.2)
IMMATURE GRANULOCYTES: 0 % (ref 0.0–5.0)
LYMPHOCYTES: 30 % (ref 16–40)
MCH: 26.8 PG — ABNORMAL LOW (ref 27–31)
MCHC: 32.1 g/dL (ref 32–36)
MCV: 83.6 FL (ref 81–99)
MONOCYTES: 7 % (ref 0–12)
MPV: 10.3 FL (ref 7.4–10.4)
NEUTROPHILS: 62 % (ref 40–70)
PLATELET: 263 10*3/uL (ref 140–450)
RBC: 4.4 M/uL (ref 4.0–5.2)
RDW: 15.3 % — ABNORMAL HIGH (ref 11.5–14.5)
WBC: 8.1 10*3/uL (ref 4.8–10.8)

## 2017-05-30 LAB — METABOLIC PANEL, COMPREHENSIVE
A-G Ratio: 0.6 — ABNORMAL LOW (ref 1.0–3.1)
ALT (SGPT): 38 U/L (ref 12–78)
AST (SGOT): 20 U/L (ref 15–37)
Albumin: 2.7 g/dL — ABNORMAL LOW (ref 3.4–5.0)
Alk. phosphatase: 78 U/L (ref 46–116)
Anion gap: 17 mmol/L (ref 10–17)
BUN/Creatinine ratio: 10 (ref 6.0–20.0)
BUN: 6 MG/DL — ABNORMAL LOW (ref 7–18)
Bilirubin, total: 0.3 MG/DL (ref 0.2–1.0)
CO2: 24 mmol/L (ref 21–32)
Calcium: 8.3 MG/DL — ABNORMAL LOW (ref 8.5–10.1)
Chloride: 101 mmol/L (ref 98–107)
Creatinine: 0.63 MG/DL (ref 0.6–1.3)
GFR est AA: >60 mL/min/{1.73_m2} (ref >60)
GFR est non-AA: >60 mL/min/{1.73_m2} (ref >60)
Globulin: 4.4 g/dL
Glucose: 94 mg/dL (ref 74–106)
Potassium: 3.6 mmol/L (ref 3.5–5.1)
Protein, total: 7.1 g/dL (ref 6.4–8.2)
Sodium: 138 mmol/L (ref 136–145)

## 2017-05-30 LAB — BETA HCG, QT
Beta HCG, QT: 27318 m[IU]/mL — ABNORMAL HIGH (ref 0–10)
hCG Quant: 27318 m[IU]/mL — ABNORMAL HIGH (ref 0–10)

## 2017-05-30 LAB — ETHYL ALCOHOL: ALCOHOL(ETHYL),SERUM: <3 MG/DL (ref <3.0)

## 2017-05-30 MED ORDER — HALOPERIDOL LACTATE 5 MG/ML IJ SOLN
5 mg/mL | INTRAMUSCULAR | Status: AC
Start: 2017-05-30 — End: 2017-05-29
  Administered 2017-05-30: 03:00:00 via INTRAMUSCULAR

## 2017-05-30 MED ORDER — SODIUM CHLORIDE 0.9% BOLUS IV
0.9 % | Freq: Once | INTRAVENOUS | Status: DC
Start: 2017-05-30 — End: 2017-05-29

## 2017-05-30 MED FILL — HALOPERIDOL LACTATE 5 MG/ML IJ SOLN: 5 mg/mL | INTRAMUSCULAR | Qty: 1

## 2017-05-30 NOTE — ED Notes (Addendum)
Patient is alert, oriented x3, and verbally responsive. Patient is stable. Patient given fluids. Patient ambulated to restroom. No aggressive behavior noted. Patient denies having discomfort or pain. Patient is awaiting for re-evaluation to be transferred. Patient is stable. Will continue to monitor.

## 2017-05-30 NOTE — Progress Notes (Signed)
Patient is a 37 year old single disabled female who was seen for follow up as she was uncooperative with previous SW and was agitated, she was medicated with Haldol and Involuntary Certificates were completed for admission but no beds were available. When seen today, patient is calm  Oriented X 4 and cooperative.  She denied feeling depressed, suicidal or homicidal  and had no hallucinations.  She recognized that she was confused and mixed up and stated that she had gone to church to worship.  Patient reported that she was discharged [facility] Friday after a week's stay and has a  prescription for Zyprexa, she also takes Latuda 40 mg at hs.  She is scheduled for an outpatient appt today or tomorrow [facility] [address] [address]  Patient denied drinking or using drugs.  She has a 12 th grade education ,receives SSDI, has 4 children being cared for by relatives, has no legal issues and no military hx.  Patient does not want to be admitted and is seeking discharge to follow up with outpatient psych tx with her current providers . Case was discussed with Dr. Albertine Gratesaraballo who agreed with discharge for patient and to follow up with current outpatient providers.  Axis 1 Unspecified bipolar and related disorder F31.9  Axis 11 Deferred  Axis 111 Pregnancy, Asthma  Axis !V  Somatic, Chronicity of illness, Socisl  Axis V  45

## 2017-05-30 NOTE — ED Notes (Signed)
Pt in bed with no distress or discomfort noted at this time. Security at bedside

## 2017-05-30 NOTE — ED Notes (Signed)
Bedside and Verbal shift change report given to Jamaicaolanda RN (oncoming nurse) by Diamantina ProvidenceMarie F Charles, RN     (offgoing nurse). Report included the following information SBAR.

## 2017-05-30 NOTE — ED Notes (Signed)
Pt resting with eyes close, security at bedside

## 2017-05-30 NOTE — ED Notes (Signed)
Assumed care of patient from night shift RN Hilda LiasMarie. Patient is asleep in ED bed 11. Patient is aroused by voice. Patient is stable. No complaints or concerns voiced at this time. Will continue to monitor.

## 2017-05-30 NOTE — ED Notes (Addendum)
Pt was signed out to myself from previous provider. Please see their note for a detailed history and physical exam.     7:15 AM   Pt is [redacted] weeks pregnant from [facility]  concerned for delusions and aggressive behavior. Pt is currently sedated w/ haldol. She was involuntarily admitted initially , pending transfer to psych hospital w/ OBGYN capabilities.    1:51 PM  After evaluation in the morning by the psychiatrist Dr.Caraballo   Seen by the PSW, after discussion with psychiatrist, recommended discharge and outpatient mental health clinics follow up as there is no indication for acute inpatient stabilization . Instruction was given to the patient    Patient has a psychiatrist and a gyn doctor, she will follow up with both    Patient educated on pertinent findings and treatment plan. Patient was given the opportunity to ask questions and voiced understanding and agreement with the treatment plan. I provided verbal and written anticipatory guidance, instructions for return to the ED, and instructions for follow up. Patient is stable for discharge.  ??    1:52 PM    The entirety of this note, signed by me, accurately reflects all works, treatments, procedures, and medical decision making performed by me, Judithann SaugerAbdullah Kaylean Tupou, MD.

## 2017-05-30 NOTE — ED Notes (Signed)
Discharge instructions reviewed and acknowledged by the patient. Patient stable. No signs of distress noted.

## 2017-05-30 NOTE — ED Notes (Signed)
Patient given an alternative breakfast per her request for (pancakes and Malawiturkey sausage). Patient is stable. No signs of distress noted. No complaints or concerns voiced. Will continue to monitor.

## 2017-06-03 ENCOUNTER — Encounter (HOSPITAL_COMMUNITY): Payer: Self-pay | Admitting: Emergency Medicine

## 2017-06-03 ENCOUNTER — Inpatient Hospital Stay
Admission: AD | Admit: 2017-06-03 | Discharge: 2017-06-10 | DRG: 833 | Disposition: A | Discharge status: Other Acute Inpatient Hospital | Payer: Medicare Other | Attending: Psychiatry | Admitting: Psychiatry

## 2017-06-03 ENCOUNTER — Emergency Department (HOSPITAL_COMMUNITY)
Admission: EM | Admit: 2017-06-03 | Discharge: 2017-06-03 | Disposition: A | Discharge status: Other Acute Inpatient Hospital | Payer: Medicare Other | Attending: Emergency Medicine | Admitting: Emergency Medicine

## 2017-06-03 ENCOUNTER — Other Ambulatory Visit: Payer: Self-pay

## 2017-06-03 DIAGNOSIS — Z3A32 32 weeks gestation of pregnancy: Secondary | ICD-10-CM

## 2017-06-03 DIAGNOSIS — Z590 Homelessness: Secondary | ICD-10-CM

## 2017-06-03 DIAGNOSIS — F259 Schizoaffective disorder, unspecified: Secondary | ICD-10-CM | POA: Diagnosis present

## 2017-06-03 DIAGNOSIS — F323 Major depressive disorder, single episode, severe with psychotic features: Secondary | ICD-10-CM | POA: Diagnosis not present

## 2017-06-03 DIAGNOSIS — Z91040 Latex allergy status: Secondary | ICD-10-CM | POA: Diagnosis not present

## 2017-06-03 DIAGNOSIS — O99343 Other mental disorders complicating pregnancy, third trimester: Principal | ICD-10-CM | POA: Diagnosis present

## 2017-06-03 DIAGNOSIS — O99013 Anemia complicating pregnancy, third trimester: Secondary | ICD-10-CM | POA: Diagnosis not present

## 2017-06-03 DIAGNOSIS — Z8659 Personal history of other mental and behavioral disorders: Secondary | ICD-10-CM | POA: Diagnosis not present

## 2017-06-03 DIAGNOSIS — Z3A28 28 weeks gestation of pregnancy: Secondary | ICD-10-CM | POA: Diagnosis not present

## 2017-06-03 DIAGNOSIS — Z87891 Personal history of nicotine dependence: Secondary | ICD-10-CM | POA: Diagnosis not present

## 2017-06-03 DIAGNOSIS — F3112 Bipolar disorder, current episode manic without psychotic features, moderate: Secondary | ICD-10-CM | POA: Diagnosis present

## 2017-06-03 DIAGNOSIS — Z9114 Patient's other noncompliance with medication regimen: Secondary | ICD-10-CM | POA: Diagnosis not present

## 2017-06-03 DIAGNOSIS — R45851 Suicidal ideations: Secondary | ICD-10-CM | POA: Diagnosis not present

## 2017-06-03 DIAGNOSIS — E860 Dehydration: Secondary | ICD-10-CM

## 2017-06-03 DIAGNOSIS — D649 Anemia, unspecified: Secondary | ICD-10-CM | POA: Insufficient documentation

## 2017-06-03 DIAGNOSIS — Z3689 Encounter for other specified antenatal screening: Secondary | ICD-10-CM

## 2017-06-03 DIAGNOSIS — Z3493 Encounter for supervision of normal pregnancy, unspecified, third trimester: Secondary | ICD-10-CM

## 2017-06-03 DIAGNOSIS — F419 Anxiety disorder, unspecified: Secondary | ICD-10-CM | POA: Diagnosis present

## 2017-06-03 DIAGNOSIS — G4700 Insomnia, unspecified: Secondary | ICD-10-CM | POA: Diagnosis present

## 2017-06-03 DIAGNOSIS — Z79899 Other long term (current) drug therapy: Secondary | ICD-10-CM | POA: Diagnosis not present

## 2017-06-03 DIAGNOSIS — O0933 Supervision of pregnancy with insufficient antenatal care, third trimester: Secondary | ICD-10-CM

## 2017-06-03 DIAGNOSIS — Z91013 Allergy to seafood: Secondary | ICD-10-CM | POA: Diagnosis not present

## 2017-06-03 DIAGNOSIS — F250 Schizoaffective disorder, bipolar type: Secondary | ICD-10-CM | POA: Diagnosis present

## 2017-06-03 DIAGNOSIS — Z880 Allergy status to penicillin: Secondary | ICD-10-CM | POA: Diagnosis not present

## 2017-06-03 HISTORY — DX: Brief psychotic disorder: F23

## 2017-06-03 HISTORY — DX: Bipolar disorder, unspecified: F31.9

## 2017-06-03 LAB — CHLAMYDIA/NGC RT PCR (ARMC ONLY)
Chlamydia Tr: NOT DETECTED
N GONORRHOEAE: NOT DETECTED

## 2017-06-03 LAB — CBC WITH DIFFERENTIAL/PLATELET
BASOS ABS: 0 10*3/uL (ref 0.0–0.1)
Basophils Relative: 0 %
EOS ABS: 0.2 10*3/uL (ref 0.0–0.7)
EOS PCT: 2 %
HCT: 34 % — ABNORMAL LOW (ref 36.0–46.0)
Hemoglobin: 11.1 g/dL — ABNORMAL LOW (ref 12.0–15.0)
LYMPHS ABS: 2.1 10*3/uL (ref 0.7–4.0)
Lymphocytes Relative: 28 %
MCH: 27.5 pg (ref 26.0–34.0)
MCHC: 32.6 g/dL (ref 30.0–36.0)
MCV: 84.4 fL (ref 78.0–100.0)
MONO ABS: 0.5 10*3/uL (ref 0.1–1.0)
Monocytes Relative: 7 %
Neutro Abs: 4.9 10*3/uL (ref 1.7–7.7)
Neutrophils Relative %: 63 %
PLATELETS: 290 10*3/uL (ref 150–400)
RBC: 4.03 MIL/uL (ref 3.87–5.11)
RDW: 15.5 % (ref 11.5–15.5)
WBC: 7.6 10*3/uL (ref 4.0–10.5)

## 2017-06-03 LAB — URINALYSIS, ROUTINE W REFLEX MICROSCOPIC
BILIRUBIN URINE: NEGATIVE
Glucose, UA: NEGATIVE mg/dL
Hgb urine dipstick: NEGATIVE
Ketones, ur: 80 mg/dL — AB
Leukocytes, UA: NEGATIVE
Nitrite: NEGATIVE
PH: 6 (ref 5.0–8.0)
Protein, ur: NEGATIVE mg/dL
SPECIFIC GRAVITY, URINE: 1.014 (ref 1.005–1.030)

## 2017-06-03 LAB — TYPE AND SCREEN
ABO/RH(D): A POS
ANTIBODY SCREEN: NEGATIVE

## 2017-06-03 LAB — COMPREHENSIVE METABOLIC PANEL
ALK PHOS: 64 U/L (ref 38–126)
ALT: 21 U/L (ref 14–54)
AST: 17 U/L (ref 15–41)
Albumin: 2.7 g/dL — ABNORMAL LOW (ref 3.5–5.0)
Anion gap: 10 (ref 5–15)
BUN: 5 mg/dL — AB (ref 6–20)
CALCIUM: 8.6 mg/dL — AB (ref 8.9–10.3)
CO2: 20 mmol/L — ABNORMAL LOW (ref 22–32)
CREATININE: 0.61 mg/dL (ref 0.44–1.00)
Chloride: 106 mmol/L (ref 101–111)
Glucose, Bld: 95 mg/dL (ref 65–99)
Potassium: 3.7 mmol/L (ref 3.5–5.1)
Sodium: 136 mmol/L (ref 135–145)
Total Bilirubin: 0.4 mg/dL (ref 0.3–1.2)
Total Protein: 6.8 g/dL (ref 6.5–8.1)

## 2017-06-03 LAB — RAPID URINE DRUG SCREEN, HOSP PERFORMED
AMPHETAMINES: NOT DETECTED
Barbiturates: NOT DETECTED
Benzodiazepines: NOT DETECTED
Cocaine: NOT DETECTED
OPIATES: NOT DETECTED
Tetrahydrocannabinol: NOT DETECTED

## 2017-06-03 LAB — ETHANOL

## 2017-06-03 MED ORDER — MAGNESIUM HYDROXIDE 400 MG/5ML PO SUSP: 30.0000 mL | Freq: Every day | ORAL | Status: DC | PRN

## 2017-06-03 MED ORDER — ACETAMINOPHEN 325 MG PO TABS: 650.0000 mg | ORAL_TABLET | Freq: Four times a day (QID) | ORAL | Status: DC | PRN

## 2017-06-03 MED ORDER — ALUM & MAG HYDROXIDE-SIMETH 200-200-20 MG/5ML PO SUSP: 30.0000 mL | ORAL | Status: DC | PRN

## 2017-06-03 MED ORDER — ALUM & MAG HYDROXIDE-SIMETH 200-200-20 MG/5ML PO SUSP: 30.0000 mL | Freq: Four times a day (QID) | ORAL | Status: DC | PRN

## 2017-06-03 MED ORDER — OLANZAPINE 10 MG IM SOLR
10.0000 mg | Freq: Once | INTRAMUSCULAR | Status: AC
  Administered 2017-06-03: 10 mg via INTRAMUSCULAR
  Filled 2017-06-03: qty 10

## 2017-06-03 MED ORDER — TRAZODONE HCL 100 MG PO TABS
100.0000 mg | ORAL_TABLET | Freq: Every evening | ORAL | Status: DC | PRN
  Administered 2017-06-03: 100 mg via ORAL
  Filled 2017-06-03: qty 1

## 2017-06-03 MED ORDER — DIPHENHYDRAMINE HCL 50 MG/ML IJ SOLN: 50.0000 mg | Freq: Four times a day (QID) | INTRAMUSCULAR | Status: DC | PRN

## 2017-06-03 MED ORDER — ONDANSETRON HCL 4 MG PO TABS: 4.0000 mg | ORAL_TABLET | Freq: Three times a day (TID) | ORAL | Status: DC | PRN

## 2017-06-03 MED ORDER — DIPHENHYDRAMINE HCL 50 MG/ML IJ SOLN
50.0000 mg | Freq: Four times a day (QID) | INTRAMUSCULAR | Status: DC | PRN
  Administered 2017-06-03: 50 mg via INTRAMUSCULAR
  Filled 2017-06-03: qty 1

## 2017-06-03 MED ORDER — LURASIDONE HCL 40 MG PO TABS
40.0000 mg | ORAL_TABLET | Freq: Every day | ORAL | Status: DC
  Administered 2017-06-03 – 2017-06-04 (×2): 40 mg via ORAL
  Filled 2017-06-03 (×2): qty 1

## 2017-06-03 MED ORDER — HALOPERIDOL LACTATE 5 MG/ML IJ SOLN: 5.0000 mg | Freq: Once | INTRAMUSCULAR | Status: DC

## 2017-06-03 MED ORDER — ACETAMINOPHEN 325 MG PO TABS: 650.0000 mg | ORAL_TABLET | ORAL | Status: DC | PRN

## 2017-06-03 MED ORDER — HYDROXYZINE HCL 25 MG PO TABS: 50.0000 mg | ORAL_TABLET | ORAL | Status: DC | PRN

## 2017-06-03 MED ORDER — STERILE WATER FOR INJECTION IJ SOLN
INTRAMUSCULAR | Status: AC
  Administered 2017-06-03: 2.1 mL
  Filled 2017-06-03: qty 10

## 2017-06-03 MED ORDER — PRENATAL MULTIVITAMIN CH
1.0000 | ORAL_TABLET | Freq: Every day | ORAL | Status: DC
  Administered 2017-06-04 – 2017-06-09 (×6): 1 via ORAL
  Filled 2017-06-03 (×7): qty 1

## 2017-06-03 NOTE — Progress Notes (Signed)
Patient ID: Jaime MerlinShawnette [name], female   DOB: 10/30/1980, 37 y.o.   MRN: [id number]   Per State regulations 482.30 this chart was reviewed for medical necessity with respect to the patient's admission/duration of stay.    Next review date:  06/07/17  Thurman CoyerEric Ioma Chismar, BSN, RN-BC  Case Manager

## 2017-06-03 NOTE — [facility] Counselor (Signed)
Patient has been accepted to [facility].  Accepting physician is Dr. Jennet MaduroPucilowska.  Attending Physician will be Dr. Johnella MoloneyMcNew.  Patient has been assigned to room 310, by [facility] Charge Nurse LankinPhyllis.  Call report to [phone].  Representative/Transfer Coordinator is Durward Mallardamille H Patient pre-admitted by [facility] Access    [facility] ER Staff Scharlene Corn(Thom, TTS) made aware of acceptance.

## 2017-06-03 NOTE — ED Triage Notes (Signed)
Pt here with [employer] voluntarially. Pt called [employer] because her "bipolar was acting up and she isnt feeling right"  questions. Pt is 7 months pregnant and has no other medical hx aside from bipolar.  Pt refuses to answer other

## 2017-06-03 NOTE — ED Notes (Signed)
TTS assessment in progress. 

## 2017-06-03 NOTE — ED Notes (Signed)
Report called to JoAnne, RN.

## 2017-06-03 NOTE — H&P (Signed)
Psychiatric Admission Assessment Adult  Patient Identification: Jaime Washington MRN:  [id number] Date of Evaluation:  06/03/2017 Chief Complaint: "The spirits told me to come here'  Principal Diagnosis:Schizoaffective Disorder, bipolar type Diagnosis:   Patient Active Problem List   Diagnosis Date Noted  . Schizoaffective disorder ([place]) [F25.9] 06/03/2017    Priority: High   History of Present Illness:   Jaime Washington is a 37 year old single African-American female originally from [place], [place] he was involuntarily committed by [facility] and transferred to [facility] secondary to psychosis. The patient says that she drove her car to [place] 2 days ago because she wanted to "sightsee". She then called the police from a convenience store because she felt like she needed to be on her medication. She says she was recently hospitalized psychiatrically in [place] 2 days before coming to [place] for approximately 1 day. She cannot remember the name of the hospital that she was admitted to. She does know that she was on Latuda 40 mg by mouth daily with dinner for some time even prior to her hospitalization. She believes that she was given a prescription for Zyprexa hospital but did not fill it. She denies any current active or passive suicidal thoughts and says she does not want to hurt her child but is endorsing auditory hallucinations of voices repeating events in her childhood that were uncomfortable for her. She denies that she was sexually or physically abused however. She says she was led by the spirits to [place] and the voices told her to come here. She does endorse racing thoughts but no grandiose delusions or increased goal-directed behavior. She denies any hyperreligiosity. She does appear to have some thought blocking and was unable to answer all questions during the session. She says she gets very easily overwhelmed and has a  difficult time remembering events in [place]. She was alert and oriented to time and gave the correct date is 06/03/2017. The patient was somewhat confused about being in the hospital and did not know the name of the hospital. She denied having any visual hallucinations. She did report depressive symptoms but cannot identify triggers for depression. She stated that she was excited for the baby and did want to have a child but currently endorses homelessness. She says she now wants to settle in [place]. She says the father of the baby is not in their life. She says she lives in [place], alone and is unemployed. She could not give any contact information for family and [place] but stated that she had one brother and one sister in [place]. Her parents are both deceased. She denies any history of any heavy alcohol use or illicit drug use. Toxicology screen was negative in the emergency room. The patient was a poor historian. No collateral information could be obtained from records through care everywhere.  Past Psychiatric History The patient reports that she has had multiple prior inpatient psychiatric hospitalizations but cannot name where she had been hospitalized, how frequently and what for specifically. She was unable to name her psychiatrist in [place] but stated that she had been on Latuda 40 mg by mouth with dinner. She says she was just discharged from the hospital 2 days before coming to [place]. She denies any prior suicide attempts.  Family psychiatric history: Patient denies any history of any mental illness in the family but also says she does not know as she was raised by her grandparents and her mother passed away  when she was young.  Social history: The patient says she was born and raised by her grandparents as her mother passed away when she was young. He did not know her father well. She says both parents are deceased and her grandparents are deceased. She denies any  history of any physical or sexual abuse. She does have one brother and one sister. She got to the 11th grade but never graduated or obtained her GED. 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 She is single and has never been married. She denies having any other children but is pregnant.  Past medical history: Approximately 8 months pregnant, due date per patient is either April 5 or May 5. She denies any history of any prior TBI or seizures  Substance abuse history: Patient denies any history of any heavy alcohol use or illicit drug use. She denies any tobacco use currently and says she quit smoking over 1 year ago.  Legal history: The patient denies any prior inpatient psychiatric hospitalizations or suicide attempts.   Associated Signs/Symptoms: Depression Symptoms:  depressed mood, impaired memory, (Hypo) Manic Symptoms:  Psychosis  Anxiety Symptoms:  None Psychotic Symptoms:  Hallucinations: Auditory PTSD Symptoms: Questionable at this time, patient would not elaborate Total Time spent with patient: 45 minutes  Is the patient at risk to self? Yes.    Has the patient been a risk to self in the past 6 months? Yes.    Has the patient been a risk to self within the distant past? Yes.    Is the patient a risk to others? No. - not sure Has the patient been a risk to others in the past 6 months? No.  - not sure Has the patient been a risk to others within the distant past? No.  - not sure  Prior Inpatient Therapy:  Yes Prior Outpatient Therapy:  Most likely  Alcohol Screening: 1. How often do you have a drink containing alcohol?: Never 2. How many drinks containing alcohol do you have on a typical day when you are drinking?: 1 or 2 3. How often do you have six or more drinks on one occasion?: Never AUDIT-C Score: 0 Intervention/Follow-up: Brief Advice Substance Abuse History in the last 12 months:  Denies Consequences of Substance Abuse: NA Previous Psychotropic  Medications: Yes Psychological Evaluations: Yes Past Medical History:  Past Medical History:  Diagnosis Date  . Bipolar disorder ([place])   . Schizophrenia, acute ([place])    History reviewed. No pertinent surgical history.   Tobacco Screening: Have you used any form of tobacco in the last 30 days? (Cigarettes, Smokeless Tobacco, Cigars, and/or Pipes): No Social History:  Social History   Substance and Sexual Activity  Alcohol Use Not on file     Social History   Substance and Sexual Activity  Drug Use Not on file       Allergies:   Allergies  Allergen Reactions  . Amoxicillin Itching  . Shellfish Allergy Hives  . Latex Rash   Lab Results:  Results for orders placed or performed during the hospital encounter of 06/03/17 (from the past 48 hour(s))  Comprehensive metabolic panel     Status: Abnormal   Collection Time: 06/03/17  4:18 AM  Result Value Ref Range   Sodium 136 135 - 145 mmol/L   Potassium 3.7 3.5 - 5.1 mmol/L   Chloride 106 101 - 111 mmol/L   CO2 20 (L) 22 - 32 mmol/L   Glucose, Bld 95 65 -  99 mg/dL   BUN 5 (L) 6 - 20 mg/dL   Creatinine, Ser 0.61 0.44 - 1.00 mg/dL   Calcium 8.6 (L) 8.9 - 10.3 mg/dL   Total Protein 6.8 6.5 - 8.1 g/dL   Albumin 2.7 (L) 3.5 - 5.0 g/dL   AST 17 15 - 41 U/L   ALT 21 14 - 54 U/L   Alkaline Phosphatase 64 38 - 126 U/L   Total Bilirubin 0.4 0.3 - 1.2 mg/dL   GFR calc non Af Amer >60 >60 mL/min   GFR calc Af Amer >60 >60 mL/min    Comment: (NOTE) The eGFR has been calculated using the CKD EPI equation. This calculation has not been validated in all clinical situations. eGFR's persistently <60 mL/min signify possible Chronic Kidney Disease.    Anion gap 10 5 - 15    Comment: Performed at [facility], [place] [address], [place], [place] [address]  Ethanol     Status: None   Collection Time: 06/03/17  4:18 AM  Result Value Ref Range   Alcohol, Ethyl (B) <10 <10 mg/dL    Comment:        LOWEST DETECTABLE LIMIT  FOR SERUM ALCOHOL IS 10 mg/dL FOR MEDICAL PURPOSES ONLY Performed at [facility] [address], [place], [place] [address]   CBC with Differential     Status: Abnormal   Collection Time: 06/03/17  4:18 AM  Result Value Ref Range   WBC 7.6 4.0 - 10.5 K/uL   RBC 4.03 3.87 - 5.11 MIL/uL   Hemoglobin 11.1 (L) 12.0 - 15.0 g/dL   HCT 34.0 (L) 36.0 - 46.0 %   MCV 84.4 78.0 - 100.0 fL   MCH 27.5 26.0 - 34.0 pg   MCHC 32.6 30.0 - 36.0 g/dL   RDW 15.5 11.5 - 15.5 %   Platelets 290 150 - 400 K/uL   Neutrophils Relative % 63 %   Neutro Abs 4.9 1.7 - 7.7 K/uL   Lymphocytes Relative 28 %   Lymphs Abs 2.1 0.7 - 4.0 K/uL   Monocytes Relative 7 %   Monocytes Absolute 0.5 0.1 - 1.0 K/uL   Eosinophils Relative 2 %   Eosinophils Absolute 0.2 0.0 - 0.7 K/uL   Basophils Relative 0 %   Basophils Absolute 0.0 0.0 - 0.1 K/uL    Comment: Performed at [facility], [place] [address] [place],  [address]  Urine rapid drug screen (hosp performed)     Status: None   Collection Time: 06/03/17  8:33 AM  Result Value Ref Range   Opiates NONE DETECTED NONE DETECTED   Cocaine NONE DETECTED NONE DETECTED   Benzodiazepines NONE DETECTED NONE DETECTED   Amphetamines NONE DETECTED NONE DETECTED   Tetrahydrocannabinol NONE DETECTED NONE DETECTED   Barbiturates NONE DETECTED NONE DETECTED    Comment: (NOTE) DRUG SCREEN FOR MEDICAL PURPOSES ONLY.  IF CONFIRMATION IS NEEDED FOR ANY PURPOSE, NOTIFY LAB WITHIN 5 DAYS. LOWEST DETECTABLE LIMITS FOR URINE DRUG SCREEN Drug Class                     Cutoff (ng/mL) Amphetamine and metabolites    1000 Barbiturate and metabolites    200 Benzodiazepine                 809 Tricyclics and metabolites     300 Opiates and metabolites        300 Cocaine and metabolites  300 THC                            50 Performed at [facility], [place] [address], [place], [place] [address]   Urinalysis, Routine w  reflex microscopic     Status: Abnormal   Collection Time: 06/03/17  8:33 AM  Result Value Ref Range   Color, Urine YELLOW YELLOW   APPearance CLEAR CLEAR   Specific Gravity, Urine 1.014 1.005 - 1.030   pH 6.0 5.0 - 8.0   Glucose, UA NEGATIVE NEGATIVE mg/dL   Hgb urine dipstick NEGATIVE NEGATIVE   Bilirubin Urine NEGATIVE NEGATIVE   Ketones, ur 80 (A) NEGATIVE mg/dL   Protein, ur NEGATIVE NEGATIVE mg/dL   Nitrite NEGATIVE NEGATIVE   Leukocytes, UA NEGATIVE NEGATIVE    Comment: Performed at [facility] [address], [place], [place] [address]    Blood Alcohol level:  Lab Results  Component Value Date   ETH <10 88/82/8003    Metabolic Disorder Labs:  No results found for: HGBA1C, MPG No results found for: PROLACTIN No results found for: CHOL, TRIG, HDL, CHOLHDL, VLDL, LDLCALC  Current Medications: Current Facility-Administered Medications  Medication Dose Route Frequency Provider Last Rate Last Dose  . acetaminophen (TYLENOL) tablet 650 mg  650 mg Oral Q6H PRN Pucilowska, Jolanta B, MD      . alum & mag hydroxide-simeth (MAALOX/MYLANTA) 200-200-20 MG/5ML suspension 30 mL  30 mL Oral Q4H PRN Pucilowska, Jolanta B, MD      . lurasidone (LATUDA) tablet 40 mg  40 mg Oral QPC supper Chauncey Mann, MD      . magnesium hydroxide (MILK OF MAGNESIA) suspension 30 mL  30 mL Oral Daily PRN Pucilowska, Jolanta B, MD      . Derrill Memo ON 06/04/2017] prenatal multivitamin tablet 1 tablet  1 tablet Oral Q1200 Pucilowska, Jolanta B, MD      . traZODone (DESYREL) tablet 100 mg  100 mg Oral QHS PRN Pucilowska, Jolanta B, MD       PTA Medications: Medications Prior to Admission  Medication Sig Dispense Refill Last Dose  . lurasidone (LATUDA) 40 MG TABS tablet Take 40 mg by mouth daily with breakfast.   Past Week at Unknown time  . OLANZapine (ZYPREXA) 10 MG tablet Take 10 mg by mouth daily.       Musculoskeletal: Strength & Muscle Tone: within normal limits Gait & Station:  normal Patient leans: N/A  Psychiatric Specialty Exam: Physical Exam  Constitutional: She is oriented to person, place, and time. She appears well-developed and well-nourished.  HENT:  Head: Normocephalic and atraumatic.  Eyes: Pupils are equal, round, and reactive to light. Conjunctivae and EOM are normal.  Neck: Normal range of motion. Neck supple.  Cardiovascular: Normal rate, regular rhythm and normal heart sounds.  Respiratory: Effort normal and breath sounds normal. No respiratory distress.  GI: Soft. She exhibits distension.  Pregnant  Musculoskeletal: Normal range of motion. She exhibits no edema.  Lymphadenopathy:    She has no cervical adenopathy.  Neurological: She is alert and oriented to person, place, and time. No cranial nerve deficit.  Skin: Skin is warm and dry. No rash noted. No erythema.  Psychiatric:  Psychotic    Review of Systems  Constitutional: Negative.  Negative for chills, diaphoresis, fever, malaise/fatigue and weight loss.  HENT: Negative.  Negative for hearing loss and sore throat.   Eyes: Negative.  Negative for  blurred vision, double vision and photophobia.  Respiratory: Negative for cough, sputum production, shortness of breath and wheezing.   Cardiovascular: Negative.  Negative for chest pain and palpitations.  Gastrointestinal: Negative.   Genitourinary: Negative for dysuria, flank pain, frequency, hematuria and urgency.       Pregnant  Musculoskeletal: Negative.  Negative for back pain, falls, joint pain, myalgias and neck pain.  Skin: Negative.  Negative for itching and rash.  Neurological: Negative.  Negative for dizziness, seizures and weakness.  Endo/Heme/Allergies: Negative.   Psychiatric/Behavioral: Positive for depression and hallucinations. The patient has insomnia.     Blood pressure 114/68, pulse 82, temperature 98.3 F (36.8 C), temperature source Oral, resp. rate 20, height 5' 4"  (1.626 m), weight 121.6 kg (268 lb), SpO2 98 %.Body  mass index is 46 kg/m.  General Appearance: Disheveled  Eye Contact:  Fair  Speech:  Slow  Volume:  Decreased  Mood:  Depressed  Affect:  Labile  Thought Process:  Disorganized  Orientation:  Oriented to time and situation btu not place  Thought Content:  Hallucinations: Auditory  Suicidal Thoughts:  No  Homicidal Thoughts:  No  Memory:  Immediate;   Poor Recent;   Poor Remote;   Poor  Judgement:  Impaired  Insight:  Lacking  Psychomotor Activity:  Decreased  Concentration:  Concentration: Poor and Attention Span: Poor  Recall:  Poor  Fund of Knowledge:  Poor  Language:  Poor  Akathisia:  No  Handed:  Left  AIMS (if indicated):     Assets:  Physical Health  ADL's:  Intact  Cognition:  Impaired,  Moderate  Sleep:       Treatment Plan Summary:   DIAGNOSIS Schizoaffective disorder, bipolar type Pregnant Severe: Homeless, unemployed   TREATMENT PLAN Jaime Washington is a 37 year old single African American female with a history of schizoaffective disorder, bipolar type, originally from [place]. The patient is endorsing psychotic symptoms including auditory hallucinations and does appear to have some thought blocking. She has been noncompliant with [place] and came to [place] based on the voices and the spirits telling her to drive here. She denies any current active or passive suicidal thoughts or intent to harm her child.  Schizoaffective disorder, bipolar type: We'll plan to restart the patient on Latuda 40 mg by mouth daily with dinner. Will check EKG to rule out QTc prolongation. We'll also check hemoglobin A1c and lipid panel. She will be placed on 15 minute checks.  Pregnancy: The patient gave the due date initially as May 5 of then later he also stated April 5. She may have been confused with the current date being April 5. OB time was consult, Dr. Glennon Mac, from [facility] and ultrasound will be ordered as well as some Baseline blood work in case the patient  goes into labor. Currently, she is denying any abdominal cramping or bleeding. She denies any intent to harm her child. She says she has not had prenatal care since mid March.  Will need to continue to try and gain collateral information with regards to prior prenatal care and prior psychiatric care. At this time, the patient cannot verbalize where she was receiving psychotropic medications and cannot give an accurate history.  Disposition: To be determined. The patient is from [place] but now states that she wants to live in [place]. It is unclear whether or not there is a legal guardian as the patient is unable to provide any information.      Daily contact with  patient to assess and evaluate symptoms and progress in treatment and Medication management  Observation Level/Precautions:  15 minute checks                 Physician Treatment Plan for Primary Diagnosis: Psychosis Long Term Goal(s): Improvement in symptoms so as ready for discharge  Short Term Goals: Ability to verbalize feelings will improve, Ability to maintain clinical measurements within normal limits will improve and Compliance with prescribed medications will improve  Physician Treatment Plan for Secondary Diagnosis: Active Problems:   Schizoaffective disorder ([place])  Long Term Goal(s): Compliance with meds  Short Term Goals: Ability to identify changes in lifestyle to reduce recurrence of condition will improve, Ability to identify and develop effective coping behaviors will improve, Compliance with prescribed medications will improve and Ability to identify triggers associated with substance abuse/mental health issues will improve  I certify that inpatient services furnished can reasonably be expected to improve the patient's condition.    Chauncey Mann, MD 4/5/20197:11 PM

## 2017-06-03 NOTE — ED Notes (Signed)
Bed: [id number] Expected date:  Expected time:  Means of arrival:  Comments: Tr 8

## 2017-06-03 NOTE — ED Notes (Addendum)
Pt stated "I was in the hospital for a week, got out and then I was in another hospital for 1 night.  All I want is to get a dose of my medicine."  Pt denies SI/HI.

## 2017-06-03 NOTE — Progress Notes (Signed)
TTS consulted with Nira ConnJason Berry, NP who recommends inpt treatment. EDP Dione BoozeGlick, David, MD advised of disposition and in agreement. Pt's nurse Consuella LoseElaine, RN made aware of inpt recommendation. TTS to seek placement. Pt is 7 months pregnant.   Princess BruinsAquicha Tatem Holsonback, MSW, LCSW Therapeutic Triage Specialist  [phone]

## 2017-06-03 NOTE — ED Provider Notes (Signed)
[facility]-EMERGENCY DEPT Provider Note   CSN: [id number] Arrival date & time: 06/03/17  0133     History   Chief Complaint Chief Complaint  Patient presents with  . Depression  . Medical Clearance    HPI Jaime Washington is a 37 y.o. female.  The history is provided by the patient. The history is limited by the condition of the patient (Psychiatric disorder).  She is 7 months pregnant and has history of bipolar disorder and states that she is having racing thoughts.  She admits to not taking her medication today, but is not sure if she had taken it yesterday.  Racing thoughts just began this morning and she relates that they were about things that have happened to her earlier.  She will not discuss it any further.  She thinks she may be having auditory hallucinations.  She denies ethanol and drug use.  She will not answer when I asked about suicidal or homicidal thoughts.  She will not answer if she is getting prenatal care.  History reviewed. No pertinent past medical history.  There are no active problems to display for this patient.   History reviewed. No pertinent surgical history.   OB History   None      Home Medications    Prior to Admission medications   Not on File    Family History No family history on file.  Social History Social History   Tobacco Use  . Smoking status: Not on file  Substance Use Topics  . Alcohol use: Not on file  . Drug use: Not on file     Allergies   Patient has no known allergies.   Review of Systems Review of Systems  Unable to perform ROS: Psychiatric disorder     Physical Exam Updated Vital Signs BP 109/67 (BP Location: Left Arm)   Pulse 84   Resp 16   Ht 5\' 4"  (1.626 m)   Wt 113.4 kg (250 lb)   SpO2 99%   BMI 42.91 kg/m   Physical Exam  Nursing note and vitals reviewed.  Morbidly obese 37 year old female, crying and agitated, but in no acute distress. Vital signs are  normal. Oxygen saturation is 99%, which is normal. Head is normocephalic and atraumatic. PERRLA, EOMI. Oropharynx is clear. Neck is nontender and supple without adenopathy or JVD. Back is nontender and there is no CVA tenderness. Lungs are clear without rales, wheezes, or rhonchi. Chest is nontender. Heart has regular rate and rhythm without murmur. Abdomen is soft, flat, nontender without masses or hepatosplenomegaly and peristalsis is normoactive.  Gravid uterus present with size consistent with dates. Extremities have no cyanosis or edema, full range of motion is present. Skin is warm and dry without rash. Neurologic: Mental status is as noted above, cranial nerves are intact, there are no motor or sensory deficits.  ED Treatments / Results  Labs (all labs ordered are listed, but only abnormal results are displayed) Labs Reviewed - No data to display  Procedures Procedures  CRITICAL CARE Performed by: Dione Booze Total critical care time: 40 minutes Critical care time was exclusive of separately billable procedures and treating other patients. Critical care was necessary to treat or prevent imminent or life-threatening deterioration. Critical care was time spent personally by me on the following activities: development of treatment plan with patient and/or surrogate as well as nursing, discussions with consultants, evaluation of patient's response to treatment, examination of patient, obtaining history from patient or  surrogate, ordering and performing treatments and interventions, ordering and review of laboratory studies, ordering and review of radiographic studies, pulse oximetry and re-evaluation of patient's condition.  Medications Ordered in ED Medications  alum & mag hydroxide-simeth (MAALOX/MYLANTA) 200-200-20 MG/5ML suspension 30 mL (has no administration in time range)  ondansetron (ZOFRAN) tablet 4 mg (has no administration in time range)  acetaminophen (TYLENOL) tablet  650 mg (has no administration in time range)  haloperidol lactate (HALDOL) injection 5 mg (5 mg Intramuscular Not Given 06/03/17 0636)  hydrOXYzine (ATARAX/VISTARIL) tablet 50 mg (has no administration in time range)  diphenhydrAMINE (BENADRYL) injection 50 mg (50 mg Intramuscular Given 06/03/17 [id number])     Initial Impression / Assessment and Plan / ED Course  I have reviewed the triage vital signs and the nursing notes.  Pertinent labs & imaging results that were available during my care of the patient were reviewed by me and considered in my medical decision making (see chart for details).  Acute psychosis in patient with history of bipolar disorder.  Third trimester pregnancy.  She needs medication for sedation.  She is given a dose of haloperidol.  She will be moved to the psychiatric holding area, TTS consultation is requested.  TTS consultation appreciated.  Patient requires inpatient care at this point.  She is voluntary at this point, but if she decides to leave, will need to be placed under involuntary commitment because of suicidal thoughts.  Final Clinical Impressions(s) / ED Diagnoses   Final diagnoses:  Current severe episode of major depressive disorder with psychotic features, unspecified whether recurrent (HCC)  Suicidal ideation  Third trimester pregnancy  Normochromic normocytic anemia    ED Discharge Orders    None       Dione BoozeGlick, Harriet Bollen, MD 06/03/17 [phone]

## 2017-06-03 NOTE — ED Notes (Signed)
Pt redirected back to bed

## 2017-06-03 NOTE — ED Notes (Addendum)
Pt given paper scrubs. Pt currently refusing to put them on. Pt not responding to verbal redirection. Pt urinated on floor. Pt did not ask to use restroom

## 2017-06-03 NOTE — ED Notes (Addendum)
Pt stated "I've been having racing thoughts about things that happened in the past.  I haven't slept or taken my meds in 2 days.  [facility]  Please don't turn off the lights or close the door.  I'm not from here.  I just came down to do some sightseeing.  I'm by myself."  Informed door cannot be closed and light can remain on.  Questioned pt if anyone knew she was traveling.  Pt stated "I'm 37 y/o.  I don't have to tell nobody I'm going sightseeing."  Questioned pt if her car was in a safe area.  Pt stated "It's at the convenience store."  Pt presents with bruise to left anterior forearm.

## 2017-06-03 NOTE — ED Notes (Signed)
Sheriff [phone]) called to arrange transport to [facility].

## 2017-06-03 NOTE — BHH Counselor (Signed)
Clinician attempted to call pt's nurse to set up tele-assessment cart however there was no response. Clinician to try again later.    Redmond Pullingreylese D Akera Snowberger, MS, [facility], [facility] Triage Specialist [phone]

## 2017-06-03 NOTE — ED Notes (Signed)
Pt out in hallway sitting in recliner refusing to go back to room stating "all I want is my medicine.  It's in my car.  I take Latuda 40 mg as needed and zyprexa 1 mg.  I haven't taken it for 2 days because I was driving and sightseeing."  Dr. Preston FleetingGlick informed.

## 2017-06-03 NOTE — BHH Counselor (Signed)
Clinician called and asked Terri, Charge Nurse if she could set up the tele-assessment cart. Per Camelia Engerri, Charge Nurse the pt is not talking to anyone, and screaming; a face to face assessment would be more effective.   Redmond Pullingreylese D Brix Brearley, MS, [facility], [facility] Triage Specialist [phone]

## 2017-06-03 NOTE — Progress Notes (Signed)
D:Pt denies SI/HI/AVH. Pt presents guarded, but pleasant to this Clinical research associatewriter. Pt. complains of not being able to sleep. Pt. Fell asleep for a couple hours, but after waking up stated she was afraid to fall asleep when this writer went to check on her. Patient observed  During the early parts of the night in her room paranoid trying not to sleep and is afraid of having the light turned off. Pt. Looks out of her room frequently and acts cautious with staff and peers at times. Pt. Asks for bottled water and will not drink water from machines. Pt. Refused ultra-sound this evening and reports, "I would like to just do it tomorrow". Pt. Also makes the same demands in a guarded manor refusing to be compliant with an EKG testing. Will continue to encourage compliance and stress importance of exams and try to reassure safety when she feels paranoid. Pt. Was compliant with urine sampling early in the shift, but presented with many questions about the purpose of the sampling and what would be done with it. Required frequent reminders and encouragement to get sampling.   A: Q x 15 minute observation checks were completed for safety. Patient was provided with education. Patient was given scheduled medications with food. Patient  was encourage to attend groups, participate in unit activities and continue with plan of care.   R:Patient is complaint with medication and unit procedures. Partial compliance with testings and exams.           Precautionary checks every 15 minutes for safety maintained, room free of safety hazards, patient sustains no injury or falls during this shift.

## 2017-06-03 NOTE — ED Notes (Signed)
Patient disharged in custody of [facility].  No distress noted.  Patient calm and cooperative.  Belongings given to sheriff.

## 2017-06-03 NOTE — Progress Notes (Signed)
Called by Dr. Caryn SectionAarti Kapur, MD, who requested recommendations for pregnancy management of her patient who was admitted for a psychotic episode. Per Dr. Maryruth BunKapur, the patient states he EDD is 4/5 and on other occasion she states it was 5/5. She has received [place] prenatal care in [place] and was recently released from a psychiatric hospital in [place] two days ago. [facility] and has [facility] (see Dr. Shelda AltesKapur's note). I offered to formerly consult the patient. However, Dr. Maryruth BunKapur does not believe an in-person consult is necessary at this time. She did ask for recommendations regarding monitoring, labs, etc. Recommend ultrasound for dating, presentation, etc. Further recommendations regarding monitoring pending ultrasound. I also order PN labs as we currently do not have a way to figure out where she received prenatal care in [place] and we, therefore, can not obtain records.   She will also need a GBS and gonorrhea and chlamydia collection.  Will follow lab results and ultrasound report and make more specific recommendations once ultrasound report is back. Per Dr. Maryruth BunKapur the patient denies contractions, fluid leaking, vaginal bleeding. The patient does note positive fetal movement.    I am happy to come to consult on this patient in-person, as needed.   Thomasene MohairStephen Ayaana Biondo, MD, Merlinda FrederickFACOG [facility], [facility] 06/03/2017 10:55 PM
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
## 2017-06-03 NOTE — Plan of Care (Signed)
Pt. Verbalizes understanding of provided education. Pt. Verbalizes she can remain safe while on the unit. Pt. Compliant with medications this evening. Pt. Denies SI/HI and is able to contract for safety.     Problem: Education: Goal: Knowledge of [facility] Education information/materials will improve Outcome: Progressing   Problem: Health Behavior/Discharge Planning: Goal: Compliance with treatment plan for underlying cause of condition will improve Outcome: Progressing   Problem: Safety: Goal: Periods of time without injury will increase Outcome: Progressing   Problem: Safety: Goal: Ability to remain free from injury will improve Outcome: Progressing

## 2017-06-03 NOTE — ED Provider Notes (Signed)
Patient accepted to [facility]. Mild dehydration noted, encouraged PO fluids. STable for transfer. EMTALA completed.   Shaune PollackIsaacs, Sonny Anthes, MD 06/03/17 [phone]

## 2017-06-03 NOTE — BHH Suicide Risk Assessment (Signed)
[facility] Admission Suicide Risk Assessment   Nursing information obtained from:   Chart Demographic factors:   37 y/o pregnant single African American Female, homeless and unemployed from [place] Current Mental Status:   See below Loss Factors:   Homeless and unemployed Historical Factors:   multiple prior inpatient psychiatric hospitalizations Risk Reduction Factors:   compliance with  Total Time spent with patient: 45 minutes Principal Problem: Psychosis  Diagnosis:   Patient Active Problem List   Diagnosis Date Noted  . Schizoaffective disorder (HCC) [F25.9] 06/03/2017    Priority: High   Subjective Data:    History of Present Illness:  Ms. Vallely is a 37 year old single African-American female originally from [place], [place] he was involuntarily committed by [facility] and transferred to [facility] secondary to psychosis. The patient says that she drove her car to [place] 2 days ago because she wanted to "sightsee". She then called the police from a convenience store because she felt like she needed to be on her medication. She says she was recently hospitalized psychiatrically in [place] 2 days before coming to [place] for approximately 1 day. She cannot remember the name of the hospital that she was admitted to. She does know that she was on Latuda 40 mg by mouth daily with dinner for some time even prior to her hospitalization. She believes that she was given a prescription for Zyprexa hospital but did not fill it. She denies any current active or passive suicidal thoughts and says she does not want to hurt her child but is endorsing auditory hallucinations of voices repeating events in her childhood that were uncomfortable for her. She denies that she was sexually or physically abused however. She says she was led by the spirits to [place] and the voices told her to come here. She does endorse racing thoughts but no grandiose  delusions or increased goal-directed behavior. She denies any hyperreligiosity. She does appear to have some thought blocking and was unable to answer all questions during the session. She says she gets very easily overwhelmed and has a difficult time remembering events in [place]. She was alert and oriented to time and gave the correct date is 06/03/2017. The patient was somewhat confused about being in the hospital and did not know the name of the hospital. She denied having any visual hallucinations. She did report depressive symptoms but cannot identify triggers for depression. She stated that she was excited for the baby and did want to have a child but currently endorses homelessness. She says she now wants to settle in [place]. She says the father of the baby is not in their life. She says she lives in [place], alone and is unemployed. She could not give any contact information for family and [place] but stated that she had one brother and one sister in [place]. Her parents are both deceased. She denies any history of any heavy alcohol use or illicit drug use. Toxicology screen was negative in the emergency room. The patient was a poor historian. No collateral information could be obtained from records through care everywhere.  Past Psychiatric History The patient reports that she has had multiple prior inpatient psychiatric hospitalizations but cannot name where she had been hospitalized, how frequently and what for specifically. She was unable to name her psychiatrist in [place] but stated that she had been on Latuda 40 mg by mouth with dinner. She says she was just discharged from the hospital 2 days before coming  to [place]. She denies any prior suicide attempts.  Family psychiatric history: Patient denies any history of any mental illness in the family but also says she does not know as she was raised by her grandparents and her mother passed away when she was young.  Social  history: The patient says she was born and raised by her grandparents as her mother passed away when she was young. He did not know her father well. She says both parents are deceased and her grandparents are deceased. She denies any history of any physical or sexual abuse. She does have one brother and one sister. She got to the 11th grade but never graduated or obtained her GED. 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 She is single and has never been married. She denies having any other children but is pregnant.  Past medical history: Approximately 8 months pregnant, due date per patient is either April 5 or May 5. She denies any history of any prior TBI or seizures  Substance abuse history: Patient denies any history of any heavy alcohol use or illicit drug use. She denies any tobacco use currently and says she quit smoking over 1 year ago.  Legal history: The patient denies any prior inpatient psychiatric hospitalizations or suicide attempts.  Continued Clinical Symptoms:    The "Alcohol Use Disorders Identification Test", Guidelines for Use in Primary Care, Second Edition.  World Science writerHealth Organization [facility](WHO). Score between 0-7:  no or low risk or alcohol related problems. Score between 8-15:  moderate risk of alcohol related problems. Score between 16-19:  high risk of alcohol related problems. Score 20 or above:  warrants further diagnostic evaluation for alcohol dependence and treatment.   CLINICAL FACTORS:   Depression:   Severe Currently Psychotic   Musculoskeletal: Strength & Muscle Tone: within normal limits Gait & Station: normal Patient leans: N/A  Psychiatric Specialty Exam: Physical Exam: See H+P  ROS: See H+P  Blood pressure 114/68, pulse 82, temperature 98.3 F (36.8 C), temperature source Oral, resp. rate 20, height 5\' 4"  (1.626 m), weight 121.6 kg (268 lb), SpO2 98 %.Body mass index is 46 kg/m.  MSE: See H+P                                                         COGNITIVE FEATURES THAT CONTRIBUTE TO RISK:  Psychosis  SUICIDE RISK:  Suicide risk is minimal but will be elevated but psychosis continues. She currently denies any access to guns. The patient does have multiple risk factors includinghomelessness, unemployment and lack of social support.  PLAN OF CARE:    Schizoaffective disorder, bipolar type Pregnant Severe: Homeless, unemployed   Ms Conley RollsShawnette Austin is a 37 year old single African American female with a history of schizoaffective disorder, bipolar type, originally from [place]. The patient is endorsing psychotic symptoms including auditory hallucinations and does appear to have some thought blocking. She has been noncompliant with JordanLatuda and came to [place]  based on the voices and the spirits telling her to drive here. She denies any current active or passive suicidal thoughts or intent to harm her child.  Schizoaffective disorder, bipolar type: We'll plan to restart the patient on Latuda 40 mg by mouth daily with dinner. Will check EKG to rule out QTc prolongation. We'll also check hemoglobin A1c and  lipid panel. She will be placed on 15 minute checks.  Pregnancy: The patient gave the due date initially as May 5 of then later he also stated April 5. She may have been confused with the current date being April 5. OB time was consult, Dr. Jean Rosenthal, from [facility] and ultrasound will be ordered as well as some Baseline blood work in case the patient goes into labor. Currently, she is denying any abdominal cramping or bleeding. She denies any intent to harm her child. She says she has not had prenatal care since mid March.  Will need to continue to try and gain collateral information with regards to prior prenatal care and prior psychiatric care. At this time, the patient cannot verbalize where she was receiving psychotropic medications and cannot give an accurate history.  Disposition: To be  determined. The patient is from [place] but now states that she wants to live in [place]. It is unclear whether or not there is a legal guardian as the patient is unable to provide any information.    I certify that inpatient services furnished can reasonably be expected to improve the patient's condition.   Darliss Ridgel, MD 06/03/2017, 7:16 PM

## 2017-06-03 NOTE — BH Assessment (Signed)
[facility]  Per Juanetta BeetsJacqueline Norman, DO, this pt requires psychiatric hospitalization.  Leda Minamille Hester, counselor reports that pt has been accepted to [facility] by Dr Jennet MaduroPucilowska to Rm 310; they will be ready to receive pt after 15:00.  Dr Sharma CovertNorman also finds that pt meets criteria for IVC, which she has initiated.  IVC documents [place] to [facility] [place] [facility], and at [employer]:10 Magistrate Brihany Butch confirms receipt.  Findings and Custody Order has since been served, and IVC documents have been faxed to [phone].  Pt's nurse, Everardo PacificKenisha, has been notified, and agrees to call report to [phone].  [place] [facility].   Doylene Canninghomas Kyasia Steuck, [place] Behavioral Health Coordinator [phone]

## 2017-06-03 NOTE — Progress Notes (Addendum)
Received Jaime Washington from [facility] via wheelchair. She is alert and oriented x4. She verbalized being led here by the [employer]. She is feeling  anxious, depressed and hearing voices at the present time. She is somewhat tearful. She was cooperative with the admission process, oriented to her new environment and given a ginger ale to drink. She retreated to her room afterwards related to the voices. Her dinner tray was ordered.

## 2017-06-04 ENCOUNTER — Inpatient Hospital Stay: Payer: Medicare Other

## 2017-06-04 LAB — LIPID PANEL
Cholesterol: 236 mg/dL — ABNORMAL HIGH (ref 0–200)
HDL: 61 mg/dL (ref >40)
LDL CALC: 149 mg/dL — AB (ref 0–99)
Total CHOL/HDL Ratio: 3.9 RATIO
Triglycerides: 130 mg/dL (ref <150)
VLDL: 26 mg/dL (ref 0–40)

## 2017-06-04 LAB — HEMOGLOBIN A1C
HEMOGLOBIN A1C: 5.7 % — AB (ref 4.8–5.6)
Mean Plasma Glucose: 116.89 mg/dL

## 2017-06-04 MED ORDER — LORAZEPAM 2 MG PO TABS
2.0000 mg | ORAL_TABLET | Freq: Once | ORAL | Status: AC
  Administered 2017-06-04: 2 mg via ORAL
  Filled 2017-06-04: qty 1
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
## 2017-06-04 NOTE — Plan of Care (Signed)
  Problem: Safety: Goal: Periods of time without injury will increase Outcome: Progressing Note:  Remains safe on the unit   Problem: Safety: Goal: Ability to remain free from injury will improve Outcome: Progressing Note:  Remains safe on the unit

## 2017-06-04 NOTE — Plan of Care (Addendum)
Patient is guarded and watchful.  No interaction with peers. No group appearance.  Will only drink bottled water.  Must look at and open medications herself.  Denies any AVH.   No inappropriate behavior noted.  Support and encouragement offered.  Safety rounds maintained.     Problem: Health Behavior/Discharge Planning: Goal: Compliance with treatment plan for underlying cause of condition will improve Outcome: Progressing Note:  Medication compliant.  No group attendance   Problem: Safety: Goal: Periods of time without injury will increase Outcome: Progressing Note:  Remains safe on the unit   Problem: Safety: Goal: Ability to remain free from injury will improve Outcome: Progressing Note:  Remains safe on the unit

## 2017-06-04 NOTE — Progress Notes (Signed)
[facility]  06/04/2017 1:08 PM Jaime Washington    Subjective:  The patient was much more clear today in terms of cognition and was not actively psychotic. She was fully alert and oriented to time, place, and situation and can provide more history than yesterday. She says that she sees a Therapist, sportspsychiatrist at the [facility] in [place] on [address] [place]. The patient said her doctor's name is Dr. Lockie MolaBullet and her therapist is Wille CelesteJanie. She reports being on Latuda for several months prior to admission. She denies any current auditory or visual hallucinations and thought processes were more organized today compared to yesterday. She denied any current active or passive suicidal thoughts.Stated that she last [place] because she always wanted to come to [place]. Even though she says she wants to settle in [place] with her child, she does not have any stable housing or support system in [place]. She says she has 4 other children and that she was taking care of 2 children and the other 2 were with their fathers. She could not state where the 2 children that she has are located.She stated at one point that her sister had them but then later stated that she didn't. She became guarded when the topic was brought up. He says she is on disability and she is her own guardian. She is excited about the pregnancyand his art he picked out a name for her son. She denies any intent to harm her child.vital signs are stable. She did not go for an ultrasound last night but will go today. She did sleep over 5 hours last night. Appetite is good. She has been social on the unit and was encouraged to attend groups today. No somatic complaints. She denies any abdominal pain or bleeding.  Past Psychiatric History The patient was previously involved 2011 at a [place] [facility]. She says it was located [address] she also has a  Paramedictherapist, Kristen CardinalJanie Washington. She has had multiple prior inpatient psychiatric hospitalizations in the past.  Family psychiatric history: Patient denies any history of any mental illness in the family but also says she does not know as she was raised by her grandparents and her mother passed away when she was young.  Social history: The patient says she was born and raised by her grandparents as her mother passed away when she was young. He did not know her father well. She says both parents are deceased and her grandparents are deceased. She denies any history of any physical or sexual abuse. She does have one brother and one sister. She got to the 11th grade but never graduated or obtained her GED. 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 She is single and has never been married. She does have 4 other children and she says her 5th child died  Past medical history: Approximately 8 months pregnant, due date per patient is either April 5 or May 5. She denies any history of any prior TBI or seizures  Substance abuse history: Patient denies any history of any heavy alcohol use or illicit drug use. She denies any tobacco use currently and says she quit smoking over 1 year ago.  Legal history: The patient says she was arrested along for second-degree assault. No pending charges.    Principal Problem: Psychosis  Diagnosis:   Patient Active Problem List   Diagnosis Date Noted  .  Schizoaffective disorder (HCC) [F25.9] 06/03/2017    Priority: High   Total Time spent with patient: 30 minutes   Past Medical History:  Past Medical History:  Diagnosis Date  . Bipolar disorder (HCC)   . Schizophrenia, acute (HCC)     Social History:  Social History   Substance and Sexual Activity  Alcohol Use Not on file     Social History   Substance and Sexual Activity  Drug Use Not on file    Social History   Socioeconomic History  . Marital status: Single    Spouse name: Not on file   . Number of children: Not on file  . Years of education: Not on file  . Highest education level: Not on file  Occupational History  . Not on file  Social Needs  . Financial resource strain: Not on file  . Food insecurity:    Worry: Not on file    Inability: Not on file  . Transportation needs:    Medical: Not on file    Non-medical: Not on file  Tobacco Use  . Smoking status: Never Smoker  . Smokeless tobacco: Never Used  Substance and Sexual Activity  . Alcohol use: Not on file  . Drug use: Not on file  . Sexual activity: Not on file  Lifestyle  . Physical activity:    Days per week: Not on file    Minutes per session: Not on file  . Stress: Not on file  Relationships  . Social connections:    Talks on phone: Not on file    Gets together: Not on file    Attends religious service: Not on file    Active member of club or organization: Not on file    Attends meetings of clubs or organizations: Not on file    Relationship status: Not on file  Other Topics Concern  . Not on file  Social History Narrative  . Not on file       Sleep: Fair  Appetite:  Good  Current Medications: Current Facility-Administered Medications  Medication Dose Route Frequency Provider Last Rate Last Dose  . acetaminophen (TYLENOL) tablet 650 mg  650 mg Oral Q6H PRN Pucilowska, Jolanta B, MD      . alum & mag hydroxide-simeth (MAALOX/MYLANTA) 200-200-20 MG/5ML suspension 30 mL  30 mL Oral Q4H PRN Pucilowska, Jolanta B, MD      . lurasidone (LATUDA) tablet 40 mg  40 mg Oral QPC supper Darliss Ridgel, MD   40 mg at 06/03/17 2152  . magnesium hydroxide (MILK OF MAGNESIA) suspension 30 mL  30 mL Oral Daily PRN Pucilowska, Jolanta B, MD      . prenatal multivitamin tablet 1 tablet  1 tablet Oral Q1200 Pucilowska, Jolanta B, MD      . traZODone (DESYREL) tablet 100 mg  100 mg Oral QHS PRN Pucilowska, Jolanta B, MD   100 mg at 06/03/17 2214    Lab Results:  Results for orders placed or performed  during the hospital encounter of 06/03/17 (from the past 48 hour(s))  Type and screen [facility]     Status: None   Collection Time: 06/03/17  7:28 PM  Result Value Ref Range   ABO/RH(D) A POS    Antibody Screen NEG    Sample Expiration      06/06/2017 Performed at [facility], [address] [place], [place] [address]   Chlamydia/NGC rt PCR [facility] only)     Status:  None   Collection Time: 06/03/17  9:25 PM  Result Value Ref Range   Specimen source GC/Chlam URINE, RANDOM    Chlamydia Tr NOT DETECTED NOT DETECTED   N gonorrhoeae NOT DETECTED NOT DETECTED    Comment: (NOTE) 100  This methodology has not been evaluated in pregnant women or in 200  patients with a history of hysterectomy. 300 400  This methodology will not be performed on patients less than 63  years of age. Performed at [facility], [address] [place], [place] [address]   Lipid panel     Status: Abnormal   Collection Time: 06/04/17  7:02 AM  Result Value Ref Range   Cholesterol 236 (H) 0 - 200 mg/dL   Triglycerides 604 <540 mg/dL   HDL 61 >98 mg/dL   Total CHOL/HDL Ratio 3.9 RATIO   VLDL 26 0 - 40 mg/dL   LDL Cholesterol 119 (H) 0 - 99 mg/dL    Comment:        Total Cholesterol/HDL:CHD Risk Coronary Heart Disease Risk Table                     Men   Women  1/2 Average Risk   3.4   3.3  Average Risk       5.0   4.4  2 X Average Risk   9.6   7.1  3 X Average Risk  23.4   11.0        Use the calculated Patient Ratio above and the CHD Risk Table to determine the patient's CHD Risk.        ATP III CLASSIFICATION (LDL):  <100     mg/dL   Optimal  147-829  mg/dL   Near or Above                    Optimal  130-159  mg/dL   Borderline  562-130  mg/dL   High  >865     mg/dL   Very High Performed at [facility], [address] [place], [place] [address]     Blood Alcohol level:  Lab Results  Component Value Date   [facility] <10 06/03/2017     Metabolic Disorder Labs: No results found for: HGBA1C, MPG No results found for: PROLACTIN Lab Results  Component Value Date   CHOL 236 (H) 06/04/2017   TRIG 130 06/04/2017   HDL 61 06/04/2017   CHOLHDL 3.9 06/04/2017   VLDL 26 06/04/2017   LDLCALC 149 (H) 06/04/2017    Physical Findings: AIMS: Facial and Oral Movements Muscles of Facial Expression: None, normal Lips and Perioral Area: None, normal Jaw: None, normal Tongue: None, normal,Extremity Movements Upper (arms, wrists, hands, fingers): None, normal Lower (legs, knees, ankles, toes): None, normal, Trunk Movements Neck, shoulders, hips: None, normal, Overall Severity Severity of abnormal movements (highest score from questions above): None, normal Incapacitation due to abnormal movements: None, normal Patient's awareness of abnormal movements (rate only patient's report): No Awareness, Dental Status Current problems with teeth and/or dentures?: No Does patient usually wear dentures?: No   Musculoskeletal: Strength & Muscle Tone: within normal limits Gait & Station: normal Patient leans: N/A  Psychiatric Specialty Exam: Physical Exam: see H+P  Review of Systems  Constitutional: Negative.   HENT: Negative.   Eyes: Negative.   Respiratory: Negative.   Cardiovascular: Negative.   Gastrointestinal: Negative.   Genitourinary: Negative.   Musculoskeletal: Negative.   Skin: Negative.   Neurological: Negative.  Endo/Heme/Allergies: Negative.     Blood pressure 96/80, pulse 88, temperature 97.8 F (36.6 C), temperature source Oral, resp. rate 18, height 5\' 4"  (1.626 m), weight 121.6 kg (268 lb), SpO2 99 %.Body mass index is 46 kg/m.  General Appearance: Disheveled  Eye Contact:  Good  Speech:  Clear and Coherent and Normal Rate  Volume:  Normal  Mood:  Depressed  Affect:  Blunt  Thought Process:  Coherent and Goal Directed  Orientation:  Full (Time, Place, and Person)  Thought Content:  Illogical   Suicidal Thoughts:  No  Homicidal Thoughts:  No  Memory:  Immediate;   Fair Recent;   Fair Remote;   Fair  Judgement:  Impaired  Insight:  Lacking  Psychomotor Activity:  Decreased  Concentration:  Concentration: Fair and Attention Span: Fair  Recall:  [employer] of Knowledge:  Fair  Language:  Good  Akathisia:  No  Handed:  Left  AIMS (if indicated):     Assets:  Communication Skills Physical Health  ADL's:  Intact  Cognition:  WNL  Sleep:  Number of Hours: 5.3     Treatment Plan Summary:   DIAGNOSIS Schizoaffective disorder, bipolar type Pregnant Severe: Homeless, unemployed   TREATMENT PLAN Ms Jaime Washington is a 37 year old single African American female with a history of schizoaffective disorder, bipolar type, originally from [place]. The patient is endorsing psychotic symptoms including auditory hallucinations and does appear to have some thought blocking. She has been noncompliant with [place] and came to [place] based on the voices and the spirits telling her to drive here. She denies any current active or passive suicidal thoughts or intent to harm her child.  Schizoaffective disorder, bipolar type: -She was restarted on  Latuda 40 mg by mouth daily with dinner.  -Total cholesterol was 236 and hemoglobin A1c is pending -EKG is not completed  Pregnancy: The patient gave the due date initially as May 5 of then later he also stated April 5. She may have been confused with the current date being April 5. OB was cosnulted,  Dr. Jean Rosenthal, from [facility] and ultrasound will be ordered as well as some Baseline blood work in case the patient goes into labor. Currently, she is denying any abdominal cramping or bleeding. She denies any intent to harm her child. She says she has not had prenatal care since mid March. Gonorrhea and Chlamydia were negative.   Disposition: To be determined. The patient is from [place] but now states that she wants to live in [place]. She was previously seeing a psychiatrist in [place]. She will need psychotropic medication management at the time of discharge.  Daily contact with patient to assess and evaluate symptoms and progress in treatment and Medication management  Darliss Ridgel, MD 06/04/2017, 1:08 PM

## 2017-06-04 NOTE — BHH Group Notes (Signed)
LCSW Group Therapy Note  06/04/2017 1:15pm  Type of Therapy and Topic:  Group Therapy:  Fears and Unhealthy Coping Skills  Participation Level:  Did Not Attend   Description of Group:  The focus of this group was to discuss some of the prevalent fears that patients experience, and to identify the commonalities among group members.  An exercise was used to initiate the discussion, followed by writing on the white board a group-generated list of unhealthy coping and healthy coping techniques to deal with each fear.    Therapeutic Goals: 1. Patient will identify and describe 3 fears they experience 2. Patient will identify one positive coping strategy for each fear they experience 3. Patient will respond empathically to peers statements regarding fears they experience  Summary of Patient Progress:  Pt invited to group but did not attend.     Therapeutic Modalities Cognitive Behavioral Therapy Motivational Interviewing  Chany Woolworth  CUEBAS-COLON, LCSW 06/04/2017 12:53 PM  

## 2017-06-04 NOTE — Plan of Care (Signed)
Pt. Verbalizes understanding of provided education. Pt. Compliant with medications, but can be apprehensive. Pt. Verbalizes she can remain safe while on the unit. Pt. Denies SI/HI. Pt. Is able to verbally contract for safety.    Problem: Education: Goal: Knowledge of [facility] Education information/materials will improve Outcome: Progressing   Problem: Health Behavior/Discharge Planning: Goal: Compliance with treatment plan for underlying cause of condition will improve Outcome: Progressing   Problem: Safety: Goal: Periods of time without injury will increase Outcome: Progressing   Problem: Safety: Goal: Ability to remain free from injury will improve Outcome: Progressing

## 2017-06-04 NOTE — Progress Notes (Signed)
D:Pt denies SI/HI/AVH. During assessment Is guarded/cautious with staff and peers. Pt. Mood is labile. Pt. During the night has a few periods of crying and yelling very loudly upset, but was not able to get into any notable detail into why she was upset. Call placed to MD for PRN medications to reduce anxieties. Medications appear to offer relief. Overall pt. Presents anxious, but is appropriate a majority of interactions and does engage frequently. Pt. Continues this shift to try to not sleep and states she is afraid to fall asleep and doesn't want to sleep. Pt. States, "in my current situation I don't want to be alone". Pt. Continues to not want to have the lights turned off for sleep. This Clinical research associatewriter frequently providing emotional support and redirection. Pt. Presents often with thought blocking/Mild confusion at times, forgetting certain things.    A: Q x 15 minute observation checks were completed for safety. Patient was provided with education. Patient was given scheduled/PRN medications. Patient  was encourage to attend groups, participate in unit activities and continue with plan of care.   R:Patient is complaint with medication and unit procedures. OB department was contacted this evening and pt. Was able to be compliant with fetal heart tone assessments. OB nursing staff performing assessment of pt. Stated she would document her assessment and fetal heart tones WDL. Pt. EKG unable to be completed this evening, will notify day-shift of need for EKG testing to be complete. Pt. Does not attend groups.            Precautionary checks every 15 minutes for safety maintained, room free of safety hazards, patient sustains no injury or falls during this shift.

## 2017-06-04 NOTE — BHH Suicide Risk Assessment (Signed)
BHH INPATIENT:  Family/Significant Other Suicide Prevention Education  Suicide Prevention Education:  Patient Refusal for Family/Significant Other Suicide Prevention Education: The patient Jaime Washington has refused to provide written consent for family/significant other to be provided Family/Significant Other Suicide Prevention Education during admission and/or prior to discharge.  Physician notified.  Honestie Kulik  CUEBAS-COLON, LCSWA 06/04/2017, 11:49 AM

## 2017-06-05 LAB — RPR: RPR Ser Ql: NONREACTIVE

## 2017-06-05 LAB — HIV ANTIBODY (ROUTINE TESTING W REFLEX): HIV Screen 4th Generation wRfx: NONREACTIVE

## 2017-06-05 LAB — HEPATITIS B SURFACE ANTIGEN: Hepatitis B Surface Ag: NEGATIVE

## 2017-06-05 MED ORDER — LURASIDONE HCL 40 MG PO TABS
60.0000 mg | ORAL_TABLET | Freq: Every day | ORAL | Status: DC
  Administered 2017-06-05 – 2017-06-09 (×5): 60 mg via ORAL
  Filled 2017-06-05 (×4): qty 2
  Filled 2017-06-05: qty 1

## 2017-06-05 MED ORDER — LORAZEPAM 2 MG PO TABS
2.0000 mg | ORAL_TABLET | Freq: Every day | ORAL | Status: DC | PRN
  Administered 2017-06-05: 2 mg via ORAL
  Filled 2017-06-05: qty 1

## 2017-06-05 NOTE — Progress Notes (Signed)
CH rounding on unit. CH spoke with patient briefly. Patient kept answers to single words. CH used mostly yes and no questions. CH provided ministry of presence and walked with patient shortly.

## 2017-06-05 NOTE — BHH Counselor (Addendum)
CSW [facility] at [phone] (after-hours number) to make a consult with DSS requested by Dr. Maryruth BunKapur. Unknown female answered (did not provide name) took CSW contact information and informed the CSW that she will have someone contacting her later on today.    Johnnye Simannia Cuebas-Colon, LCSWA  06/05/2017

## 2017-06-05 NOTE — BHH Counselor (Signed)
CSW received a call from Deputy Dinan from the [place] [employer] stating he went to [address] [place] [id number] to do a security check with the patient's family and no one was at the residence. Deputy Dinan advised the CSW to call CPS on Monday to make an official report at [phone] since there is nothing further they can do at this time.   Johnnye Simannia Cuebas-Colon, LCSWA 06/05/2017

## 2017-06-05 NOTE — BHH Counselor (Signed)
CSW received call from Ms. White ([place]SS worker from [facility]) stating that the patient is not in their system to contact [place] at [phone], it shows that the patient had a case with child protective services (CPS) with [place] in 2018 and appears to be closed. Ms. Cliffton AstersWhite could not provide any additional information about the case.    CSW contacted [place] [facility] at [phone] and spoke with Mr. Waldon MerlRonald Bowyer. Mr. Hilma FavorsBowyer took patient's information and informed CSW that he will look in the system and will call back with an update.    Ardyce Heyer Cuebas Colon, LCSWA 06/05/2017

## 2017-06-05 NOTE — BHH Group Notes (Signed)
LCSW Group Therapy Note 06/05/2017 1:15pm  Type of Therapy and Topic: Group Therapy: Feelings Around Returning Home & Establishing a Supportive Framework and Supporting Oneself When Supports Not Available  Participation Level: None  Description of Group:  Patients first processed thoughts and feelings about upcoming discharge. These included fears of upcoming changes, lack of change, new living environments, judgements and expectations from others and overall stigma of mental health issues. The group then discussed the definition of a supportive framework, what that looks and feels like, and how do to discern it from an unhealthy non-supportive network. The group identified different types of supports as well as what to do when your family/friends are less than helpful or unavailable  Therapeutic Goals  1. Patient will identify one healthy supportive network that they can use at discharge. 2. Patient will identify one factor of a supportive framework and how to tell it from an unhealthy network. 3. Patient able to identify one coping skill to use when they do not have positive supports from others. 4. Patient will demonstrate ability to communicate their needs through discussion and/or role plays.  Summary of Patient Progress:  Pt attended group but did not participate.   Therapeutic Modalities Cognitive Behavioral Therapy Motivational Interviewing   Theresa Wedel  CUEBAS-COLON, LCSW 06/05/2017 11:49 AM

## 2017-06-05 NOTE — Progress Notes (Signed)
Fetal Monitoring done; fetal heart rate of 140 Beats per min.

## 2017-06-05 NOTE — Progress Notes (Signed)
Patient has been pacing in the hallway with mild confusion. Appears to be responding to internal stimuli. Guarded and talking to self, laughing at times. Patient unable to program in activities with peers: poorly focused with poor judgement. Patient requested Ativan for anxiety and restlessness. At bedtime, patient refused to  Go to bed. Currently sitting  Near the nurses station and being monitored.

## 2017-06-05 NOTE — BHH Counselor (Signed)
CSW received call from [facility] DSS worker [place]. Ms. Jaime Washington took patient information, she stated that the computer system is down but once the system is back working she will try to find the patient and let us know if the patient's children are in the system or if they belong to a different county. Ms. Jaime Washington indicated she will call back before the end of the day.   Jaime Washington, LCSWA  06/05/2017

## 2017-06-05 NOTE — Plan of Care (Signed)
Patient is alert to self and place, denies SI, HI and AVH today even though appears to be responding to internal stimuli as evidenced by laughing and talking to herself in the hallway. Patient does not interact with peers but will pace in the hallway. Patient is experiencing mild confusion, as evidenced by not being able to find her room this morning.RN reoriented patient to unit. Patient appears to be guarded and experiencing paranoia. Affect is labile, expression is flat and voice is soft. Patient appears very disheveled in scrubs, mobility very slow moving but steady. RN preformed EKG on patient, and Called the OB unit for  Fetal monitoring to be done today. Patient stated she was able to eat breakfast and voiced no pain. Nurse will continue to [facility],Safety checks Q 15 minutes will continue. Problem: Education: Goal: Knowledge of [facility] Education information/materials will improve Outcome: Progressing Goal: Emotional status will improve Outcome: Not Progressing Goal: Mental status will improve Outcome: Not Progressing Goal: Verbalization of understanding the information provided will improve Outcome: Progressing

## 2017-06-05 NOTE — Plan of Care (Signed)
Pacing in the hallway, refusing to go to bed (paranoid). Able to verbalize her feelings

## 2017-06-05 NOTE — Progress Notes (Signed)
[facility] MD Progress Note  06/05/2017 9:39 AM Jaime Washington  MRN:  [id number]    Subjective:   The patient did have an episode last night of lability in which she was crying and yelling. She received Ativan 2 mg. She calmed down after the Ativan was able to sleep for a while but sleep was interrupted. Per nursing, she only slept 4.3 hours. She does report that her mood overall is better and is denying any auditory or visual hallucinations but per nursing was noted to have some thought blocking yesterday. She denies any current suicidal thoughts but insight and judgment remain poor. She is still not willing to sign consent for people that she left her children with. The patient changes her story on who she left the children with. First she says she left him with her sister then she later stated she left him with her daughter. She denies any somatic complaints including abdominal pain or bleeding.She is aware that ultrasound her at 32-1/2 weeks of pregnancy. Fetal heart tones were present yesterday. Vital signs have been stable   Past Psychiatric History The patient was previously involved 2011 at a clinic in more [place] [facility]. She says it was located [address] she also has a Paramedic, Kristen Cardinal. She has had multiple prior inpatient psychiatric hospitalizations in the past.  Family psychiatric history: Patient denies any history of any mental illness in the family but also says she does not know as she was raised by her grandparents and her mother passed away when she was young.  Social history: The patient says she was born and raised by her grandparents as her mother passed away when she was young. He did not know her father well. She says both parents are deceased and her grandparents are deceased. She denies any history of any physical or sexual abuse. She does have one brother and one sister. She got to the 11th grade but never graduated or obtained her GED. 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 She is single and has never been married. She does have 4 other children and she says her 5th child died  Past medical history: Approximately 8 months pregnant, due date per patient is either April 5 or May 5. She denies any history of any prior TBI or seizures  Substance abuse history: Patient denies any history of any heavy alcohol use or illicit drug use. She denies any tobacco use currently and says she quit smoking over 1 year ago.  Legal history: The patient says she was arrested along for second-degree assault. No pending charges.    Principal Problem: Psychosis  Diagnosis:   Patient Active Problem List   Diagnosis Date Noted  . Schizoaffective disorder (HCC) [F25.9] 06/03/2017    Priority: High   Total Time spent with patient: 30 minutes   Past Medical History:  Past Medical History:  Diagnosis Date  . Bipolar disorder (HCC)   . Schizophrenia, acute (HCC)     Social History:  Social History   Substance and Sexual Activity  Alcohol Use Not on file     Social History   Substance and Sexual Activity  Drug Use Not on file    Social History   Socioeconomic History  . Marital status: Single    Spouse name: Not on file  . Number of children: Not on file  . Years of education: Not on file  . Highest education level: Not on file  Occupational History  .  Not on file  Social Needs  . Financial resource strain: Not on file  . Food insecurity:    Worry: Not on file    Inability: Not on file  . Transportation needs:    Medical: Not on file    Non-medical: Not on file  Tobacco Use  . Smoking status: Never Smoker  . Smokeless tobacco: Never Used  Substance and Sexual Activity  . Alcohol use: Not on file  . Drug use: Not on file  . Sexual activity: Not on file  Lifestyle  . Physical activity:    Days per week: Not on file    Minutes per session: Not on file  . Stress: Not on file  Relationships  . Social  connections:    Talks on phone: Not on file    Gets together: Not on file    Attends religious service: Not on file    Active member of club or organization: Not on file    Attends meetings of clubs or organizations: Not on file    Relationship status: Not on file  Other Topics Concern  . Not on file  Social History Narrative  . Not on file       Sleep: Poor  Appetite:  Good  Current Medications: Current Facility-Administered Medications  Medication Dose Route Frequency Provider Last Rate Last Dose  . acetaminophen (TYLENOL) tablet 650 mg  650 mg Oral Q6H PRN Pucilowska, Jolanta B, MD      . alum & mag hydroxide-simeth (MAALOX/MYLANTA) 200-200-20 MG/5ML suspension 30 mL  30 mL Oral Q4H PRN Pucilowska, Jolanta B, MD      . LORazepam (ATIVAN) tablet 2 mg  2 mg Oral Daily PRN Darliss Ridgel, MD      . Lurasidone HCl TABS 60 mg  60 mg Oral QPC supper Darliss Ridgel, MD      . magnesium hydroxide (MILK OF MAGNESIA) suspension 30 mL  30 mL Oral Daily PRN Pucilowska, Jolanta B, MD      . prenatal multivitamin tablet 1 tablet  1 tablet Oral Q1200 Pucilowska, Jolanta B, MD   1 tablet at 06/04/17 1401  . traZODone (DESYREL) tablet 100 mg  100 mg Oral QHS PRN Pucilowska, Jolanta B, MD   100 mg at 06/03/17 2214    Lab Results:  Results for orders placed or performed during the hospital encounter of 06/03/17 (from the past 48 hour(s))  Hepatitis B surface antigen     Status: None   Collection Time: 06/03/17  7:28 PM  Result Value Ref Range   Hepatitis B Surface Ag Negative Negative    Comment: (NOTE) Performed At: [facility] [address] [place], [place] [id number] Jolene Schimke MD NW:[id number] Performed at [facility], [address] [place], [place] [address]   RPR     Status: None   Collection Time: 06/03/17  7:28 PM  Result Value Ref Range   RPR Ser Ql Non Reactive Non Reactive    Comment: (NOTE) Performed At: [facility] [address]  [place], [place] [id number] Jolene Schimke MD LK:[id number] Performed at [facility] [address] [place], [place] [address]   Type and screen [facility]     Status: None   Collection Time: 06/03/17  7:28 PM  Result Value Ref Range   ABO/RH(D) A POS    Antibody Screen NEG    Sample Expiration      06/06/2017 Performed at [facility], [address] [place], [place] [address]   HIV antibody (routine testing)     Status: None   Collection Time: 06/03/17  7:28 PM  Result Value Ref Range   HIV Screen 4th Generation wRfx Non Reactive Non Reactive    Comment: (NOTE) Performed At: [facility] [address] [place], [place] [id number] Jolene SchimkeNagendra Sanjai MD XB:[id number]:[phone] Performed at [facility], [address] [place], [place] [address]   Chlamydia/NGC rt PCR [facility]     Status: None   Collection Time: 06/03/17  9:25 PM  Result Value Ref Range   Specimen source GC/Chlam URINE, RANDOM    Chlamydia Tr NOT DETECTED NOT DETECTED   N gonorrhoeae NOT DETECTED NOT DETECTED    Comment: (NOTE) 100  This methodology has not been evaluated in pregnant women or in 200  patients with a history of hysterectomy. 300 400  This methodology will not be performed on patients less than 7814  years of age. Performed at [facility], [address] [place], [place] [address]   Hemoglobin A1c     Status: Abnormal   Collection Time: 06/04/17  7:02 AM  Result Value Ref Range   Hgb A1c MFr Bld 5.7 (H) 4.8 - 5.6 %    Comment: (NOTE) Pre diabetes:          5.7%-6.4% Diabetes:              >6.4% Glycemic control for   <7.0% adults with diabetes    Mean Plasma Glucose 116.89 mg/dL    Comment: Performed at [facility], [address] [place], [place] [address]  Lipid panel     Status: Abnormal   Collection Time: 06/04/17  7:02 AM  Result Value Ref Range   Cholesterol 236 (H) 0 - 200 mg/dL   Triglycerides 841130 <324<150  mg/dL   HDL 61 >40>40 mg/dL   Total CHOL/HDL Ratio 3.9 RATIO   VLDL 26 0 - 40 mg/dL   LDL Cholesterol 102149 (H) 0 - 99 mg/dL    Comment:        Total Cholesterol/HDL:CHD Risk Coronary Heart Disease Risk Table                     Men   Women  1/2 Average Risk   3.4   3.3  Average Risk       5.0   4.4  2 X Average Risk   9.6   7.1  3 X Average Risk  23.4   11.0        Use the calculated Patient Ratio above and the CHD Risk Table to determine the patient's CHD Risk.        ATP III CLASSIFICATION (LDL):  <100     mg/dL   Optimal  725-366100-129  mg/dL   Near or Above                    Optimal  130-159  mg/dL   Borderline  440-347160-189  mg/dL   High  >425>190     mg/dL   Very High Performed at [facility], [address] [place], [place] [address]     Blood Alcohol level:  Lab Results  Component Value Date   [facility] <10 06/03/2017    Metabolic Disorder Labs: Lab Results  Component Value Date   HGBA1C 5.7 (H) 06/04/2017   MPG 116.89 06/04/2017   No results found for: PROLACTIN Lab Results  Component  Value Date   CHOL 236 (H) 06/04/2017   TRIG 130 06/04/2017   HDL 61 06/04/2017   CHOLHDL 3.9 06/04/2017   VLDL 26 06/04/2017   LDLCALC 149 (H) 06/04/2017    Physical Findings: AIMS: Facial and Oral Movements Muscles of Facial Expression: None, normal Lips and Perioral Area: None, normal Jaw: None, normal Tongue: None, normal,Extremity Movements Upper (arms, wrists, hands, fingers): None, normal Lower (legs, knees, ankles, toes): None, normal, Trunk Movements Neck, shoulders, hips: None, normal, Overall Severity Severity of abnormal movements (highest score from questions above): None, normal Incapacitation due to abnormal movements: None, normal Patient's awareness of abnormal movements (rate only patient's report): No Awareness, Dental Status Current problems with teeth and/or dentures?: No Does patient usually wear dentures?: No   Musculoskeletal: Strength & Muscle  Tone: within normal limits Gait & Station: normal Patient leans: N/A  Psychiatric Specialty Exam: Physical Exam : see H+P  Review of Systems  Constitutional: Negative.   HENT: Negative.   Eyes: Negative.   Respiratory: Negative.   Cardiovascular: Negative.   Gastrointestinal: Negative.   Genitourinary: Negative.   Musculoskeletal: Negative.   Skin: Negative.   Neurological: Negative.   Endo/Heme/Allergies: Negative.     Blood pressure (!) 116/100, pulse (!) 126, temperature 97.9 F (36.6 C), temperature source Oral, resp. rate 18, height 5\' 4"  (1.626 m), weight 121.6 kg (268 lb), SpO2 97 %.Body mass index is 46 kg/m.  General Appearance: Disheveled  Eye Contact:  Good  Speech:  Clear and Coherent and Normal Rate  Volume:  Normal  Mood:  Depressed but labile in past 24 hours  Affect:  Blunt  Thought Process:  Coherent and Goal Directed  Orientation:  Full (Time, Place, and Person)  Thought Content:  Illogical  Suicidal Thoughts:  No  Homicidal Thoughts:  No  Memory:  Immediate;   Fair Recent;   Fair Remote;   Fair  Judgement:  Impaired  Insight:  Lacking  Psychomotor Activity:  Normal  Concentration:  Concentration: Fair and Attention Span: Fair  Recall:  [employer] of Knowledge:  Fair  Language:  Good  Akathisia:  No  Handed:  Left  AIMS (if indicated):     Assets:  Communication Skills Physical Health  ADL's:  Intact  Cognition:  WNL  Sleep:  Number of Hours: 4.3     Treatment Plan Summary:   DIAGNOSIS Schizoaffective disorder, bipolar type Pregnant Severe: Homeless, unemployed   TREATMENT PLAN Jaime Washington is a 37 year old single African American female with a history of schizoaffective disorder, bipolar type, originally from [place]. The patient is endorsing psychotic symptoms including auditory hallucinations and does appear to have some thought blocking. She has been noncompliant with [place] and came to [place] based on the voices  and the spirits telling her to drive here. She denies any current active or passive suicidal thoughts or intent to harm her child.  Schizoaffective disorder, bipolar type: -She was restarted on Latuda 40 mg by mouthdaily with dinner but will increase dosage to total 60 mg by mouth daily for mood stabilization -Total cholesterol was 236 and hemoglobin A1c was 5.7. -EKGis still not completed  Pregnancy: Ultrasound showed 32.5 weeks of pregnancy on ultrasound. OB was cosnulted,  Dr. Jean Rosenthal, from [facility] and ultrasound will be ordered as well as some Baseline blood work in case the patient goes into labor. Currently, she is denying any abdominal cramping or bleeding. She denies any intent to harm her child. She says she has not  had prenatal care since mid March. Gonorrhea and Chlamydia were negative.   SW consult to DSS: The patient changes her story on where her children are and in whose care she left them. She is not willing to sign consents for any family members to be contacted. It is unclear whether not she can take care of her child after delivery either.  Disposition: To be determined. The patient is from [place] but now states that she wants to live in [place]. She was previously seeing a psychiatrist in [place]. She will need psychotropic medication management at the time of discharge.  Daily contact with patient to assess and evaluate symptoms and progress in treatment and Medication management  Darliss Ridgel, MD 06/05/2017, 9:39 AM

## 2017-06-05 NOTE — BHH Counselor (Addendum)
CSW received called from CPS worker Waldon MerlRonald Bowyer from [facility] [phone] stating that the patient has multiple addresses but the most recent one is [address] [place] [id number] in which she is receiving TCA  (Temporary Cash Assistance) benefit, [place]. He provided [facility] [place] Sheriff Dept [phone]. Mr. Hilma FavorsBowyer advised CSW to contact [facility] and request a security check with the family, [facility] does not have 24-hour line for CPS just the sherff department.    CSW contacted [facility] [employer] [employer] at [phone] and spoke with operator # [id number] requesting a security check with the patient's family. CSW provided address given by Mr. Bowyer. Operator took CSW contact information and will have sheriff to call back with an update.    Ashaya Raftery  CUEBAS-COLON, LCSWA 06/05/2017

## 2017-06-06 ENCOUNTER — Telehealth: Payer: Self-pay

## 2017-06-06 LAB — VARICELLA ZOSTER ANTIBODY, IGG: Varicella IgG: 846 index (ref >165)

## 2017-06-06 LAB — RUBELLA SCREEN: Rubella: 3.95 index (ref >0.99)

## 2017-06-06 MED ORDER — QUETIAPINE FUMARATE 25 MG PO TABS: 50.0000 mg | ORAL_TABLET | Freq: Three times a day (TID) | ORAL | Status: DC | PRN

## 2017-06-06 NOTE — Tx Team (Addendum)
Interdisciplinary Treatment and Diagnostic Plan Update  06/06/2017 Time of Session: 11:00am Jaime Washington MRN: [id number]  Principal Diagnosis: Schizoaffective disorder [facility](HCC)  Secondary Diagnoses: Principal Problem:   Schizoaffective disorder (HCC)   Current Medications:  Current Facility-Administered Medications  Medication Dose Route Frequency Provider Last Rate Last Dose  . acetaminophen (TYLENOL) tablet 650 mg  650 mg Oral Q6H PRN Pucilowska, Jolanta B, MD      . lurasidone (LATUDA) tablet 60 mg  60 mg Oral QPC supper Darliss RidgelKapur, Aarti K, MD   60 mg at 06/05/17 1833  . prenatal multivitamin tablet 1 tablet  1 tablet Oral Q1200 Pucilowska, Jolanta B, MD   1 tablet at 06/06/17 1210  . QUEtiapine (SEROQUEL) tablet 50 mg  50 mg Oral TID PRN McNew, Ileene HutchinsonHolly R, MD       PTA Medications: Medications Prior to Admission  Medication Sig Dispense Refill Last Dose  . lurasidone (LATUDA) 40 MG TABS tablet Take 40 mg by mouth daily with breakfast.   Past Week at Unknown time  . OLANZapine (ZYPREXA) 10 MG tablet Take 10 mg by mouth daily.       Patient Stressors:    Patient Strengths:    Treatment Modalities: Medication Management, Group therapy, Case management,  1 to 1 session with clinician, Psychoeducation, Recreational therapy.   Physician Treatment Plan for Primary Diagnosis: Schizoaffective disorder (HCC) Long Term Goal(s): Improvement in symptoms so as ready for discharge   Short Term Goals: Ability to verbalize feelings will improve Ability to maintain clinical measurements within normal limits will improve Compliance with prescribed medications will improve Ability to identify changes in lifestyle to reduce recurrence of condition will improve Ability to identify and develop effective coping behaviors will improve Compliance with prescribed medications will improve Ability to identify triggers associated with substance abuse/mental health issues will improve  Medication  Management: Evaluate patient's response, side effects, and tolerance of medication regimen.  Therapeutic Interventions: 1 to 1 sessions, Unit Group sessions and Medication administration.  Evaluation of Outcomes: Progressing  Physician Treatment Plan for Secondary Diagnosis: Principal Problem:   Schizoaffective disorder (HCC)  Long Term Goal(s): Improvement in symptoms so as ready for discharge   Short Term Goals: Ability to verbalize feelings will improve Ability to maintain clinical measurements within normal limits will improve Compliance with prescribed medications will improve Ability to identify changes in lifestyle to reduce recurrence of condition will improve Ability to identify and develop effective coping behaviors will improve Compliance with prescribed medications will improve Ability to identify triggers associated with substance abuse/mental health issues will improve     Medication Management: Evaluate patient's response, side effects, and tolerance of medication regimen.  Therapeutic Interventions: 1 to 1 sessions, Unit Group sessions and Medication administration.  Evaluation of Outcomes: Progressing   RN Treatment Plan for Primary Diagnosis: Schizoaffective disorder (HCC) Long Term Goal(s): Knowledge of disease and therapeutic regimen to maintain health will improve  Short Term Goals: Ability to participate in decision making will improve and Ability to identify and develop effective coping behaviors will improve  Medication Management: RN will administer medications as ordered by provider, will assess and evaluate patient's response and provide education to patient for prescribed medication. RN will report any adverse and/or side effects to prescribing provider.  Therapeutic Interventions: 1 on 1 counseling sessions, Psychoeducation, Medication administration, Evaluate responses to treatment, Monitor vital signs and CBGs as ordered, Perform/monitor CIWA, COWS,  AIMS and Fall Risk screenings as ordered, Perform wound care treatments as ordered.  Evaluation  of Outcomes: Progressing   LCSW Treatment Plan for Primary Diagnosis: Schizoaffective disorder (HCC) Long Term Goal(s): Safe transition to appropriate next level of care at discharge, Engage patient in therapeutic group addressing interpersonal concerns.  Short Term Goals: Engage patient in aftercare planning with referrals and resources, Identify triggers associated with mental health/substance abuse issues and Increase skills for wellness and recovery  Therapeutic Interventions: Assess for all discharge needs, 1 to 1 time with Social worker, Explore available resources and support systems, Assess for adequacy in community support network, Educate family and significant other(s) on suicide prevention, Complete Psychosocial Assessment, Interpersonal group therapy.  Evaluation of Outcomes: Progressing   Progress in Treatment: Attending groups: Yes. Participating in groups: Yes. Taking medication as prescribed: Yes. Toleration medication: Yes. Family/Significant other contact made: No, will contact:  won't allow Patient understands diagnosis: Yes. Discussing patient identified problems/goals with staff: Yes. Medical problems stabilized or resolved: Yes. Denies suicidal/homicidal ideation: Yes. Issues/concerns per patient self-inventory: No. Other:    New problem(s) identified: Yes, Describe:     New Short Term/Long Term Goal(s):  Discharge Plan or Barriers:   Reason for Continuation of Hospitalization: Hallucinations Medication stabilization , disorganization   Estimated Length of Stay: 5-7 days  Recreational Therapy: Patient Stressors: Anxiety, Racing thoughts, Nervous Patient Goal:  Patient will identify 3 benefits of managing their anxiety in a healthy manner within 5 recreation therapy group sessions.  Attendees: [name] 06/06/2017 4:01 PM  Physician: Corinna Gab 06/06/2017 4:01 PM  Nursing: Leonia Reader, RN 06/06/2017 4:01 PM  RN Care Manager: 06/06/2017 4:01 PM  Social Worker: Jake Shark, LCSW 06/06/2017 4:01 PM  Recreational Therapist: Garret Reddish, LRT 06/06/2017 4:01 PM  Other:  06/06/2017 4:01 PM  Other:  06/06/2017 4:01 PM  Other: 06/06/2017 4:01 PM    Scribe for Treatment Team: Glennon Mac, LCSW 06/06/2017 4:01 PM

## 2017-06-06 NOTE — Progress Notes (Addendum)
[facility] MD Progress Note  06/06/2017 3:04 PM Jaime Washington  MRN:  [id number] Subjective:  History was reviewed with patient. She appears much less confused today and able to give better history. She states that she was living in [place] but her lease is "expired." She states that she has always wanted to see [place] and has friends that live somewhere in the state but she is not sure where. She came here to "[place]." She states that her kids are with her sister, Leavy Cella. She states that her plan was to move to [place] and then bring them down once she got settled. She states that her sister does not know where she is but "She knew I was going to be traveling." She does not want anyone to contact her sister and also does not know her number. She states, "I'm an adult and I can move if I want to."  She states that she was driving and not sleeping much. She missed a few doses of her Latuda and had a lot of racing thoughts. She parked her car at a gas station and asked to call the police who then brought her to the hospital. She states that she has a history of schizoaffective disorder and sees a therapist at [facility] [address]. She sees Cammie Mcgee there. She has been on Latuda for awhile and wants to only stay on this. She states that she has been on many medications in the past and has had side effects, especially Abilify. She asks about Geodon and wants to get on that after she delivers the baby. She reports taht she was hospitalized twice recently and they added Zyprexa the last time but never got it filled. She stats that she has a long history of "voices in my head." She states that she does not have any AH today and racing thoughts are improving. She did feel paranoid last night and did not sleep well. She is able to state that she is on medicare and medicaid and that she has these cards with her and also her ID. She states that she gets $1005 a month and has 800 dollars in her account right  now. She wants to stay in [place] and thinking about staying in [place] but would be willing to look a anywhere in the state. She states, "I just want to start over. I know I have kids but I'll bring them down later when I'm settled." She denies SI today. She does not want to change or add any medications due to pregnancy. She is able to explain what Latuda does and that she knows she has to take it with food for it to be effective.  She is excited for the baby and has a name picked out, [place]. She adamantly denies any thoughts or plan to harm the baby. She wants to get prenatal care in [place].  She states that her due date is May 23rd. Affect is brighter today.   With her verbal permission, I spoke to her therapist, Cammie Mcgee at [facility] [phone]. He states that she has been very symptomatic the past 3-4 weeks. Her last visit with him was March 13th. She was having more psychotic symptoms around that time. When she is ill, she tends to be very hyperreligious, listens to [employer] 27/7 and watches service on TV constantly. She has been more hyperreligious the past appointment. In March, she was really struggling. She was  on Latuda and Depakote but when she got pregnant, Depakote was stopped. Pt got upset about this and then said she wasn't going to take any medications. Her medications are prescribed by Dr. Asa Lente. She was likely off her medications. She was hospitalized at [facility] [place] from March 22-29. She stated then that she had racing thoughts and was having a breakdown. ON March 31st, she was sent to another hospital, [facility] after she was found ringing the bell of a church with no shoes. She was Emergency petitioned and held for 72hrs but never admitted and discharged. They added Zyrpexa 5 mg at that time but she never filled it. She did not show up for most recent appointment. She did require Ativan during hospitalization  recently due to agitation. It could be likely that she has been off medications for longer than 2 days. He is very glad that she is safe here. She does have significant other, T.J. Pt refuses to allow anyone to contact him. He states that her most recent address is [address] [place], [place].  Principal Problem: <principal problem not specified> Diagnosis:   Patient Active Problem List   Diagnosis Date Noted  . Schizoaffective disorder (HCC) [F25.9] 06/03/2017   Total Time spent with patient: 35 minutes  Past Psychiatric History: See H&P  Past Medical History:  Past Medical History:  Diagnosis Date  . Bipolar disorder (HCC)   . Schizophrenia, acute (HCC)    History reviewed. No pertinent surgical history. Family History: History reviewed. No pertinent family history. Family Psychiatric  History: See H&P Social History:  Social History   Substance and Sexual Activity  Alcohol Use Not on file     Social History   Substance and Sexual Activity  Drug Use Not on file    Social History   Socioeconomic History  . Marital status: Single    Spouse name: Not on file  . Number of children: Not on file  . Years of education: Not on file  . Highest education level: Not on file  Occupational History  . Not on file  Social Needs  . Financial resource strain: Not on file  . Food insecurity:    Worry: Not on file    Inability: Not on file  . Transportation needs:    Medical: Not on file    Non-medical: Not on file  Tobacco Use  . Smoking status: Never Smoker  . Smokeless tobacco: Never Used  Substance and Sexual Activity  . Alcohol use: Not on file  . Drug use: Not on file  . Sexual activity: Not on file  Lifestyle  . Physical activity:    Days per week: Not on file    Minutes per session: Not on file  . Stress: Not on file  Relationships  . Social connections:    Talks on phone: Not on file    Gets together: Not on file    Attends religious service: Not on file     Active member of club or organization: Not on file    Attends meetings of clubs or organizations: Not on file    Relationship status: Not on file  Other Topics Concern  . Not on file  Social History Narrative  . Not on file   Additional Social History:                         Sleep: Poor  Appetite:  Fair  Current Medications: Current Facility-Administered  Medications  Medication Dose Route Frequency Provider Last Rate Last Dose  . acetaminophen (TYLENOL) tablet 650 mg  650 mg Oral Q6H PRN Pucilowska, Jolanta B, MD      . LORazepam (ATIVAN) tablet 2 mg  2 mg Oral Daily PRN Darliss Ridgel, MD   2 mg at 06/05/17 2250  . lurasidone (LATUDA) tablet 60 mg  60 mg Oral QPC supper Darliss Ridgel, MD   60 mg at 06/05/17 1833  . prenatal multivitamin tablet 1 tablet  1 tablet Oral Q1200 Pucilowska, Jolanta B, MD   1 tablet at 06/06/17 1210  . QUEtiapine (SEROQUEL) tablet 50 mg  50 mg Oral TID PRN Meylin Stenzel, Ileene Hutchinson, MD        Lab Results: No results found for this or any previous visit (from the past 48 hour(s)).  Blood Alcohol level:  Lab Results  Component Value Date   ETH <10 06/03/2017    Metabolic Disorder Labs: Lab Results  Component Value Date   HGBA1C 5.7 (H) 06/04/2017   MPG 116.89 06/04/2017   No results found for: PROLACTIN Lab Results  Component Value Date   CHOL 236 (H) 06/04/2017   TRIG 130 06/04/2017   HDL 61 06/04/2017   CHOLHDL 3.9 06/04/2017   VLDL 26 06/04/2017   LDLCALC 149 (H) 06/04/2017    Physical Findings: AIMS: Facial and Oral Movements Muscles of Facial Expression: None, normal Lips and Perioral Area: None, normal Jaw: None, normal Tongue: None, normal,Extremity Movements Upper (arms, wrists, hands, fingers): None, normal Lower (legs, knees, ankles, toes): None, normal, Trunk Movements Neck, shoulders, hips: None, normal, Overall Severity Severity of abnormal movements (highest score from questions above): None, normal Incapacitation  due to abnormal movements: None, normal Patient's awareness of abnormal movements (rate only patient's report): No Awareness, Dental Status Current problems with teeth and/or dentures?: No Does patient usually wear dentures?: No  CIWA:    COWS:     Musculoskeletal: Strength & Muscle Tone: within normal limits Gait & Station: normal Patient leans: N/A  Psychiatric Specialty Exam: Physical Exam  Nursing note and vitals reviewed.   ROS  Blood pressure 116/77, pulse 81, temperature 98 F (36.7 C), temperature source Oral, resp. rate 16, height 5\' 4"  (1.626 m), weight 121.6 kg (268 lb), SpO2 100 %.Body mass index is 46 kg/m.  General Appearance: Disheveled  Eye Contact:  Fair  Speech:  Clear and Coherent  Volume:  Normal  Mood:  Euthymic  Affect:  Constricted  Thought Process:  Coherent and Goal Directed, some mild thought blocking  Orientation:  Full (Time, Place, and Person)  Thought Content:  Logical  Suicidal Thoughts:  No  Homicidal Thoughts:  No  Memory:  Immediate;   Fair  Judgement:  Impaired  Insight:  Lacking  Psychomotor Activity:  Normal  Concentration:  Concentration: Poor  Recall:  Poor  Fund of Knowledge:  Fair  Language:  Fair  Akathisia:  No      Assets:  Resilience  ADL's:  Intact  Cognition:  Impaired,  Mild  Sleep:  Number of Hours: 1.3     Treatment Plan Summary: 37 yo female with history of schizoaffective disorder and currently pregnant (32 1/2 weeks) admitted due to psychosis. There is a high likelihood that she has been noncompliant with medications and decompensating and impulsively left her home in [place] and drove to [place]. Thought Process appears to be improving and she is much better able to give history and details. Insight is poor.  She is adamant that she does not want us calling her sister or S.O and also does not have their phone numbers. Her kids are with her sister. DSS is involved with this. She does have some residual  paranoia dn thought blocking but is improving.   Plan:  Schizoaffective disorder -She was restarted on Latuda and increased to 60 mg daily. She does not want to change medications or add medications -EKG QTc 423 -Will add prn Seroquel for paranoia, insomnia, anxiety -d/c Ativa  Pregnancy -Ultrasound showed 32.5 weeks -OB consulted -denies any cramping, She is excited for the pregnancy -Gonorrhea and Chlamydia negative -HIV negative, Rubella immune. Hep B negative, UA negative  Dispo -Pt is adamant that she wants to stay in [place]. CSW to look into [facility] maternity home in [place]. If this is an option, will likely have to go to shelter or rent room with her money. She will need prenatal care and mental health care if she stays in [place]-possibly referral to ACT team due to frequent hospitalizations. I [place] therapist in [place] Xzavian Semmel R Atom Solivan, MD 06/06/2017, 3:04 PM

## 2017-06-06 NOTE — Progress Notes (Signed)
Patient ID: Jaime Washington, female   DOB: 05/27/1980, 37 y.o.   MRN: [id number] CSW met with Pt to discuss discharge options.  CSW called several places, Will not meet criteria for [facility] in [place] as she is too far along to really benefit from their program.  They suggested [facility] in [place], they have opening and can accept the Pt on any day except Thursdays.  They ask that we call the day or two before she discharges to make sure there is still an opening, [phone].  CSW also left voicemail with [facility] in [place], [place].  They are only open from 9-2pm daily, CSW will call back in the morning.  Dossie Arbour, LCSW

## 2017-06-06 NOTE — [facility] Group Notes (Signed)
[facility] Group Notes:  (Nursing/MHT/Case Management/Adjunct)  Date:  06/06/2017  Time:  4:14 PM  Type of Therapy:  Psychoeducational Skills  Participation Level:  Did Not Attend   Lynelle SmokeCara Travis [facility] Facility[facility] 06/06/2017, 4:14 PM

## 2017-06-06 NOTE — Plan of Care (Signed)
  Problem: Education: Goal: Emotional status will improve Outcome: Progressing Note:  Patient pleasant and cooperative this am.  Smiles with engagement.  Denies depression at this time.  Verbalizes some anxiety.   Goal: Mental status will improve Outcome: Progressing Note:  Patient pleasant and cooperative this am.  Smiles with engagement.  Denies depression at this time.  Verbalizes some anxiety.     Problem: Coping: Goal: Ability to verbalize frustrations and anger appropriately will improve Outcome: Progressing Goal: Ability to demonstrate self-control will improve Outcome: Progressing   Problem: Health Behavior/Discharge Planning: Goal: Compliance with treatment plan for underlying cause of condition will improve Outcome: Progressing Note:  Attending groups although there is no active participation.     Problem: Safety: Goal: Periods of time without injury will increase Outcome: Progressing Note:  Remains safe on the unit   Problem: Safety: Goal: Ability to remain free from injury will improve Outcome: Completed/Met

## 2017-06-06 NOTE — Progress Notes (Signed)
Fetal hear monitoring performed.  FHR 150/min

## 2017-06-06 NOTE — Progress Notes (Signed)
Recreation Therapy Notes  INPATIENT RECREATION THERAPY ASSESSMENT  Patient Details Name: Jaime Washington MRN: [id number] DOB: 12/03/1980 Today's Date: 06/06/2017       Information Obtained From: Patient  Able to Participate in Assessment/Interview: Yes  Patient Presentation: Responsive  Reason for Admission (Per Patient): Active Symptoms, Other (Comments), Med Non-Compliance(Anxiety, Racing thoughts, Nervous)  Patient Stressors:    Coping Skills:   Talk(Reach out to therapist)  Leisure Interests (2+):  Crafts - Painting, Music - Listen, Sports coachCommunity - Movies  Frequency of Recreation/Participation: Monthly  Awareness of Community Resources:     [employer]Community Resources:     Current Use:    If no, Barriers?:    Expressed Interest in [employer] CorporationCommunity Resource Information:    [place]  Patient Main Form of Transportation: Set designerCar  Patient Strengths:  Caring, Helping others  Patient Identified Areas of Improvement:  Being more patient  Patient Goal for Hospitalization:  Get treatment for symptoms and perticipate in groups  Current SI (including self-harm):  No  Current HI:  No  Current AVH: No  Staff Intervention Plan: Group Attendance, Collaborate with Interdisciplinary Treatment Team  Consent to Intern Participation: N/A  Jaime Washington 06/06/2017, 2:36 PM

## 2017-06-06 NOTE — Progress Notes (Signed)
Recreation Therapy Notes  Date: 06/06/2017  Time: 9:30 am  Location: Craft Room  Behavioral response: Appropriate  Intervention Topic: Creative Expression  Discussion/Intervention: Group content on today was focused on creative expressions. The group defined creative expressions and ways they use creative expressions. Individual identified other positive ways creative expressions can be used and why it is important to express yourself. Patients participated in the intervention "expressive painting", where they had a chance to creatively express themselves.  Clinical Observations/Feedback:  Patient came to group and defined creative expressions as something you create. She stated she likes to paint to creatively express herself. Individual expressed she participates in creative expressions so she will not hold things in. Jaime Washington LRT/CTRS         Jaime Washington 06/06/2017 12:29 PM

## 2017-06-07 NOTE — [facility] Group Notes (Signed)
  06/07/2017  Time: 0900  Type of Therapy and Topic: Group Therapy: Goals Group: SMART Goals   Participation Level:  Active   Description of Group:   The purpose of a daily goals group is to assist and guide patients in setting recovery/wellness-related goals. The objective is to set goals as they relate to the crisis in which they were admitted. Patients will be using SMART goal modalities to set measurable goals. Characteristics of realistic goals will be discussed and patients will be assisted in setting and processing how one will reach their goal. Facilitator will also assist patients in applying interventions and coping skills learned in psycho-education groups to the SMART goal and process how one will achieve defined goal.   Therapeutic Goals:  -Patients will develop and document one goal related to or their crisis in which brought them into treatment.  -Patients will be guided by LCSW using SMART goal setting modality in how to set a measurable, attainable, realistic and time sensitive goal.  -Patients will process barriers in reaching goal.  -Patients will process interventions in how to overcome and successful in reaching goal.   Patient's Goal: Pt continues to work towards their tx goals but has not yet reached them. Pt was able to appropriately participate in group discussion, and was able to offer support/validation to other group members. Pt reported her goal for today is to, "practice at least two coping skills by the end of the day to improve my mood."   Therapeutic Modalities:  Motivational Interviewing  Cognitive Behavioral Therapy  Crisis Intervention Model  SMART goals setting   Heidi DachKelsey Varvara Legault, MSW, LCSW Clinical Social Worker 06/07/2017 9:35 AM

## 2017-06-07 NOTE — [facility] Group Notes (Signed)
[facility] Group Notes:  (Nursing/MHT/Case Management/Adjunct)  Date:  06/07/2017  Time:  9:08 PM  Type of Therapy:  Group Therapy  Participation Level:  Active  Participation Quality:  Appropriate  Affect:  Appropriate  Cognitive:  Appropriate  Insight:  Good  Engagement in Group:  Engaged  Modes of Intervention:  Activity  Summary of Progress/Problems:  [name] 06/07/2017, 9:08 PM

## 2017-06-07 NOTE — Progress Notes (Signed)
Patient ID: Jaime Washington, female   DOB: 06/13/1980, 37 y.o.   MRN: [id number] PER STATE REGULATIONS 482.30  THIS CHART WAS REVIEWED FOR MEDICAL NECESSITY WITH RESPECT TO THE PATIENT'S ADMISSION/ DURATION OF STAY.  NEXT REVIEW DATE: 06/11/2017  Willa RoughJENNIFER JONES Del Overfelt, RN, BSN CASE MANAGER

## 2017-06-07 NOTE — Progress Notes (Signed)
[facility]  06/07/2017 2:04 PM Jaime Washington  MRN:  [id number] Subjective:  Pt states "I am feeling so much better." She states that the voices have improved and is no longer hearing any. She slept better last night and did not feel paranoid. She denies SI or thoughts of self harm. Seh was concerned about the swelling in her feet last night but was reassured when the nurse checked fetal heart tones. She elevated her feet and it improved. She was able to work with the Child psychotherapistsocial worker yesterday and made many phone calls to different places for her to stay. They attempted to find where her car was but unable to locate it. Pt displays some poor judgement at times. She states that she wants to just leave it in the impound lot and go to [place] to the maternity house and worry about it later. She states that she would rather just take the train to [place]. We discussed this in detail that it would be better to get her car out now because the bill would get higher the longer she keeps it in there. IT would also be difficult for her to get back to [place] from [place] to retrieve it. She then expressed understanding of this and "want to be patient." She is really pushing for discharge to go to the place in [place] that they had called yesterday. She states that her first goals are to "get myself better and get outpatient and then get an OB and fill my medications. I also need to get a phone." She is fully oriented.   Principal Problem: Schizoaffective disorder (HCC) Diagnosis:   Patient Active Problem List   Diagnosis Date Noted  . Schizoaffective disorder (HCC) [F25.9] 06/03/2017    Priority: High   Total Time spent with patient: 30 minutes  Past Psychiatric History: See H&P  Past Medical History:  Past Medical History:  Diagnosis Date  . Bipolar disorder (HCC)   . Schizophrenia, acute (HCC)    History reviewed. No pertinent surgical history. Family History: History  reviewed. No pertinent family history. Family Psychiatric  History: See H&P Social History:  Social History   Substance and Sexual Activity  Alcohol Use Not on file     Social History   Substance and Sexual Activity  Drug Use Not on file    Social History   Socioeconomic History  . Marital status: Single    Spouse name: Not on file  . Number of children: Not on file  . Years of education: Not on file  . Highest education level: Not on file  Occupational History  . Not on file  Social Needs  . Financial resource strain: Not on file  . Food insecurity:    Worry: Not on file    Inability: Not on file  . Transportation needs:    Medical: Not on file    Non-medical: Not on file  Tobacco Use  . Smoking status: Never Smoker  . Smokeless tobacco: Never Used  Substance and Sexual Activity  . Alcohol use: Not on file  . Drug use: Not on file  . Sexual activity: Not on file  Lifestyle  . Physical activity:    Days per week: Not on file    Minutes per session: Not on file  . Stress: Not on file  Relationships  . Social connections:    Talks on phone: Not on file    Gets together: Not on file    Attends religious  service: Not on file    Active member of club or organization: Not on file    Attends meetings of clubs or organizations: Not on file    Relationship status: Not on file  Other Topics Concern  . Not on file  Social History Narrative  . Not on file   Additional Social History:                         Sleep: Fair  Appetite:  Fair  Current Medications: Current Facility-Administered Medications  Medication Dose Route Frequency Provider Last Rate Last Dose  . acetaminophen (TYLENOL) tablet 650 mg  650 mg Oral Q6H PRN Pucilowska, Jolanta B, MD      . lurasidone (LATUDA) tablet 60 mg  60 mg Oral QPC supper Darliss Ridgel, MD   60 mg at 06/06/17 1753  . prenatal multivitamin tablet 1 tablet  1 tablet Oral Q1200 Pucilowska, Jolanta B, MD   1 tablet at  06/07/17 1139  . QUEtiapine (SEROQUEL) tablet 50 mg  50 mg Oral TID PRN Zvi Duplantis, Ileene Hutchinson, MD        Lab Results: No results found for this or any previous visit (from the past 48 hour(s)).  Blood Alcohol level:  Lab Results  Component Value Date   ETH <10 06/03/2017    Metabolic Disorder Labs: Lab Results  Component Value Date   HGBA1C 5.7 (H) 06/04/2017   MPG 116.89 06/04/2017   No results found for: PROLACTIN Lab Results  Component Value Date   CHOL 236 (H) 06/04/2017   TRIG 130 06/04/2017   HDL 61 06/04/2017   CHOLHDL 3.9 06/04/2017   VLDL 26 06/04/2017   LDLCALC 149 (H) 06/04/2017    Physical Findings: AIMS: Facial and Oral Movements Muscles of Facial Expression: None, normal Lips and Perioral Area: None, normal Jaw: None, normal Tongue: None, normal,Extremity Movements Upper (arms, wrists, hands, fingers): None, normal Lower (legs, knees, ankles, toes): None, normal, Trunk Movements Neck, shoulders, hips: None, normal, Overall Severity Severity of abnormal movements (highest score from questions above): None, normal Incapacitation due to abnormal movements: None, normal Patient's awareness of abnormal movements (rate only patient's report): No Awareness, Dental Status Current problems with teeth and/or dentures?: No Does patient usually wear dentures?: No  CIWA:    COWS:     Musculoskeletal: Strength & Muscle Tone: within normal limits Gait & Station: normal Patient leans: N/A  Psychiatric Specialty Exam: Physical Exam  Nursing note and vitals reviewed.   Review of Systems  All other systems reviewed and are negative.   Blood pressure (!) 93/54, pulse 94, temperature 98.4 F (36.9 C), temperature source Oral, resp. rate 16, height 5\' 4"  (1.626 m), weight 121.6 kg (268 lb), SpO2 99 %.Body mass index is 46 kg/m.  General Appearance: Casual  Eye Contact:  Good  Speech:  Clear and Coherent  Volume:  Normal  Mood:  Euthymic  Affect:  Appropriate   Thought Process:  Coherent and Goal Directed  Orientation:  Full (Time, Place, and Person)  Thought Content:  Logical  Suicidal Thoughts:  No  Homicidal Thoughts:  No  Memory:  Immediate;   Fair  Judgement:  Impaired  Insight:  Lacking  Psychomotor Activity:  Normal  Concentration:  Concentration: Poor  Recall:  [employer] of Knowledge:  Fair  Language:  Fair  Akathisia:  No      Assets:  Resilience  ADL's:  Intact  Cognition:  WNL  Sleep:  Number of Hours: 6.3     Treatment Plan Summary: 37 yo female with history of schizoaffective disorder admitted due to decompensation. She has been medication compliant and showing significant improvements in thoughts process. She does show slightly poor judgement at times and impulsivity as she wants to just leave her car in the impound and go to [place] but after talking with her about it she does agree to try to get her car first. She was able to make very appropriate phone calls with CSW to try to set up a place to live that helps with pregnant women. There is one in [place] that would be able to take her. She is showing much better ability to care for herself and navigate what she needs to do such as outpatient appointments.   Plan:  Schizoaffective disorder -Continue Latuda 60 mg daily. -Continue prn Seroquel   Pregnancy -Ultrasound shows her to be at 32.5 weeks. Fetal heart tones are being monitored and normal  Dispo -CSW helping with locating her car and getting her into safe shelter for pregnant women.  Haskell Riling, MD 06/07/2017, 2:04 PM

## 2017-06-07 NOTE — Plan of Care (Signed)
Patient slept for Estimated Hours of 6.30; Precautionary checks every 15 minutes for safety maintained, room free of safety hazards, patient sustains no injury or falls during this shift.  Problem: Education: Goal: Knowledge of [facility] Education information/materials will improve Outcome: Progressing Goal: Emotional status will improve Outcome: Progressing Goal: Mental status will improve Outcome: Progressing Goal: Verbalization of understanding the information provided will improve Outcome: Progressing   Problem: Coping: Goal: Ability to verbalize frustrations and anger appropriately will improve Outcome: Progressing Goal: Ability to demonstrate self-control will improve Outcome: Progressing   Problem: Health Behavior/Discharge Planning: Goal: Identification of resources available to assist in meeting health care needs will improve Outcome: Progressing Goal: Compliance with treatment plan for underlying cause of condition will improve Outcome: Progressing   Problem: Safety: Goal: Periods of time without injury will increase Outcome: Progressing

## 2017-06-07 NOTE — Progress Notes (Signed)
Patient ID: Jaime Washington, female   DOB: 08/08/1980, 37 y.o.   MRN: [id number] Patient was initially pleasant, "I came from [place]. I just decided to drive for sight seeing; there are 2 houses in [place] that I wanted to see; I always like to live in [place]. I stop by at a gas station, I was feel myself and asked the Cashier to make a call for me so the police brought me to the hospital..."  Cold tray provided per patient's request.  About 2330 hours: patient said, "My feet are swollen, I want to go to labor and delivery.." HOB and EOB raised to comfort, education provided but patient insisted to go to labor and delivery unit.  I called Ms Shanda BumpsJessica @ [phone] to come an assess to monitor FHR and it was 147 @ 0100 am.  Patient was reassured and finally went to bed.

## 2017-06-07 NOTE — Progress Notes (Signed)
Recreation Therapy Notes   Date: 06/07/2017  Time: 9:30 am  Location: Craft Room  Behavioral response: Appropriate  Intervention Topic: Goals  Discussion/Intervention:  Group content on today was focused on goals. Patients described what goals are and how they define goals. Individuals expressed how they go about setting goals and reaching them. The group identified how important goals are and if they make short term goals to reach long term goals. Patients described how many goals they work on at a time and what affects them not reaching their goal. Individuals described how much time they put into planning and obtaining their goals. The group participated in the intervention "My Goal Board" and made personal goal boards to help them achieve their goal Clinical Observations/Feedback:  Patient came to group late due to unknown reasons. She participated in the intervention and was social with staff. Sherif Millspaugh LRT/CTRS         Jaime Washington 06/07/2017 10:46 AM

## 2017-06-07 NOTE — Progress Notes (Signed)
Fetal heart tone completed by Baxter HireKristen RN from [facility]. Heart rate fluctuated between 120-130.

## 2017-06-07 NOTE — Progress Notes (Signed)
Doppler FHT 141. Pt denies vaginal bleeding or discharge. Denies pain. Reported to [employer] Chism, RN ([facility]).

## 2017-06-07 NOTE — Plan of Care (Signed)
Patient up and present in the milieu. Affect /mood  is pleasant but some paranoia is noted during meal and medication administration. Compliant with medications and meals. Reports that her appetite is good, energy is normal and concentration is good. Goals for today are to participate on classes to help him manage her stress. Fetal heart rate monitored today via maternity nurse, Nira RetortAmanda Stone, Rn. Per [place], [place] 144 patient denied any abdominal pain or discomfort. Denies vaginal bleeding. Milieu remains safe.

## 2017-06-07 NOTE — Progress Notes (Signed)
Patient denied pain at this time. Was tearful earlier this shift. Patient indicated to this RN that she was going to bed and maybe will be fine lying down. Refused to take Seroquel  PRN stated, I'm pregnant and can't take that medication." Reassurance and support offered by this RN.  Participated in group session. Will continue 15 minutes checks.

## 2017-06-07 NOTE — [facility] Group Notes (Signed)
06/07/2017 1PM  Type of Therapy/Topic:  Group Therapy:  Feelings about Diagnosis  Participation Level:  Active   Description of Group:   This group will allow patients to explore their thoughts and feelings about diagnoses they have received. Patients will be guided to explore their level of understanding and acceptance of these diagnoses. Facilitator will encourage patients to process their thoughts and feelings about the reactions of others to their diagnosis and will guide patients in identifying ways to discuss their diagnosis with significant others in their lives. This group will be process-oriented, with patients participating in exploration of their own experiences, giving and receiving support, and processing challenge from other group members.   Therapeutic Goals: 1. Patient will demonstrate understanding of diagnosis as evidenced by identifying two or more symptoms of the disorder 2. Patient will be able to express two feelings regarding the diagnosis 3. Patient will demonstrate their ability to communicate their needs through discussion and/or role play  Summary of Patient Progress: Actively and appropriately engaged in the group. Patient was able to provide support and validation to other group members.Patient practiced active listening when interacting with the facilitator and other group members Patient in still in the process of obtaining treatment goals. Jaime Washington spoke about her diagnosis and the symptoms that present in her. She was able to identify coping skills such as "listening to music and communicating with other people."   Therapeutic Modalities:   Cognitive Behavioral Therapy Brief Therapy Feelings Identification    [name]  Amairani Shuey, LCSW 06/07/2017 1:43 PM

## 2017-06-08 MED ORDER — BACITRACIN-NEOMYCIN-POLYMYXIN 400-5-5000 EX OINT
TOPICAL_OINTMENT | Freq: Every day | CUTANEOUS | Status: DC | PRN
Start: 2017-06-08 — End: 2017-06-10

## 2017-06-08 MED ORDER — LURASIDONE HCL 60 MG PO TABS: 60.0000 mg | ORAL_TABLET | Freq: Every day | ORAL | 1 refills | Status: DC

## 2017-06-08 MED ORDER — HYDROCORTISONE 0.5 % EX CREA
TOPICAL_CREAM | Freq: Every day | CUTANEOUS | Status: DC | PRN
Start: 2017-06-08 — End: 2017-06-10
  Administered 2017-06-08 – 2017-06-09 (×2): 1 via TOPICAL
  Filled 2017-06-08: qty 28.35

## 2017-06-08 MED ORDER — LORAZEPAM 2 MG PO TABS: 2.0000 mg | ORAL_TABLET | Freq: Four times a day (QID) | ORAL | Status: DC | PRN

## 2017-06-08 NOTE — Progress Notes (Signed)
D- Patient alert and oriented. Patient presents in a pleasant mood on assessment stating that she slept "pretty good" last night and did not voice any complaints to this writer at this time. Patient states to this writer "I feel like I'm ready to go". Patient denies SI, HI, AVH, and pain at this time. Patient's goal for today is "stay positive and productive and continue to comply with everything that they want me to comply with". Patient intends on accomplishing her goal by "go to groups and communicate with my social worker".   A- Support and encouragement provided.  Routine safety checks conducted every 15 minutes.  Patient informed to notify staff with problems or concerns.  R- Patient receptive, calm, and cooperative. Patient interacts well with others on the unit.  Patient remains safe at this time.

## 2017-06-08 NOTE — Progress Notes (Signed)
[facility]  06/08/2017 10:31 AM Zuley Johney FrameCherell Redler  MRN:  [id number] Subjective:  Per RN notes, she was slightly labile and emotional last night. She only slept 3 hours. Today, pt states that she is emotional because of the hormones and pregnancy. She states taht she feels ready to go and wants to get things on the move. She did make many phone calls yesterday to try to set up various appointments. She states that she still wants to go get her car which she found out is parked at the [employer] gas station and then she will drive to the facility in [place] where she is going to stay. She states that she called to set up mental health appointments and OB appointments close to there. She was able to do all of these things with very little help. She states that racing thoughts are improved. Denies AH, VH. She has been calm on the unit and she is thinking a lot more rationally and less impulsively.   Principal Problem: Schizoaffective disorder (HCC) Diagnosis:   Patient Active Problem List   Diagnosis Date Noted  . Schizoaffective disorder (HCC) [F25.9] 06/03/2017    Priority: High   Total Time spent with patient: 20 minutes  Past Psychiatric History: See H&P  Past Medical History:  Past Medical History:  Diagnosis Date  . Bipolar disorder (HCC)   . Schizophrenia, acute (HCC)    History reviewed. No pertinent surgical history. Family History: History reviewed. No pertinent family history. Family Psychiatric  History: See H&P Social History:  Social History   Substance and Sexual Activity  Alcohol Use Not on file     Social History   Substance and Sexual Activity  Drug Use Not on file    Social History   Socioeconomic History  . Marital status: Single    Spouse name: Not on file  . Number of children: Not on file  . Years of education: Not on file  . Highest education level: Not on file  Occupational History  . Not on file  Social Needs  . Financial resource  strain: Not on file  . Food insecurity:    Worry: Not on file    Inability: Not on file  . Transportation needs:    Medical: Not on file    Non-medical: Not on file  Tobacco Use  . Smoking status: Never Smoker  . Smokeless tobacco: Never Used  Substance and Sexual Activity  . Alcohol use: Not on file  . Drug use: Not on file  . Sexual activity: Not on file  Lifestyle  . Physical activity:    Days per week: Not on file    Minutes per session: Not on file  . Stress: Not on file  Relationships  . Social connections:    Talks on phone: Not on file    Gets together: Not on file    Attends religious service: Not on file    Active member of club or organization: Not on file    Attends meetings of clubs or organizations: Not on file    Relationship status: Not on file  Other Topics Concern  . Not on file  Social History Narrative  . Not on file   Additional Social History:                         Sleep: Poor  Appetite:  Fair  Current Medications: Current Facility-Administered Medications  Medication Dose Route Frequency Provider  Last Rate Last Dose  . acetaminophen (TYLENOL) tablet 650 mg  650 mg Oral Q6H PRN Pucilowska, Jolanta B, MD      . LORazepam (ATIVAN) tablet 2 mg  2 mg Oral Q6H PRN Correy Weidner R, MD      . lurasidone (LATUDA) tablet 60 mg  60 mg Oral QPC supper Darliss Ridgel, MD   60 mg at 06/07/17 1707  . prenatal multivitamin tablet 1 tablet  1 tablet Oral Q1200 Pucilowska, Jolanta B, MD   1 tablet at 06/07/17 1139  . QUEtiapine (SEROQUEL) tablet 50 mg  50 mg Oral TID PRN Ardith Test, Ileene Hutchinson, MD        Lab Results: No results found for this or any previous visit (from the past 48 hour(s)).  Blood Alcohol level:  Lab Results  Component Value Date   ETH <10 06/03/2017    Metabolic Disorder Labs: Lab Results  Component Value Date   HGBA1C 5.7 (H) 06/04/2017   MPG 116.89 06/04/2017   No results found for: PROLACTIN Lab Results  Component Value  Date   CHOL 236 (H) 06/04/2017   TRIG 130 06/04/2017   HDL 61 06/04/2017   CHOLHDL 3.9 06/04/2017   VLDL 26 06/04/2017   LDLCALC 149 (H) 06/04/2017    Physical Findings: AIMS: Facial and Oral Movements Muscles of Facial Expression: None, normal Lips and Perioral Area: None, normal Jaw: None, normal Tongue: None, normal,Extremity Movements Upper (arms, wrists, hands, fingers): None, normal Lower (legs, knees, ankles, toes): None, normal, Trunk Movements Neck, shoulders, hips: None, normal, Overall Severity Severity of abnormal movements (highest score from questions above): None, normal Incapacitation due to abnormal movements: None, normal Patient's awareness of abnormal movements (rate only patient's report): No Awareness, Dental Status Current problems with teeth and/or dentures?: No Does patient usually wear dentures?: No  CIWA:    COWS:     Musculoskeletal: Strength & Muscle Tone: within normal limits Gait & Station: normal Patient leans: N/A  Psychiatric Specialty Exam: Physical Exam  ROS  Blood pressure 109/82, pulse (!) 103, temperature 98.7 F (37.1 C), temperature source Oral, resp. rate 16, height 5\' 4"  (1.626 m), weight 121.6 kg (268 lb), SpO2 98 %.Body mass index is 46 kg/m.  General Appearance: Casual  Eye Contact:  Good  Speech:  Clear and Coherent  Volume:  Normal  Mood:  Euthymic  Affect:  Appropriate  Thought Process:  Coherent and Goal Directed  Orientation:  Full (Time, Place, and Person)  Thought Content:  Logical  Suicidal Thoughts:  No  Homicidal Thoughts:  No  Memory:  Immediate;   Fair  Judgement:  Fair  Insight:  Fair  Psychomotor Activity:  Normal  Concentration:  Concentration: Fair  Recall:  [employer] of Knowledge:  Fair  Language:  Fair  Akathisia:  No      Assets:  Resilience  ADL's:  Intact  Cognition:  WNL  Sleep:  Number of Hours: 6.3     Treatment Plan Summary: 37 yo female admitted due to psychosis. Thought process  is improving significantly and she is thinking a lot more rationally. She was able to make several phone calls on her own yesterday to set up all her care in [place]. She did not sleep well last night and was slightly labile but this could be due to pregnancy. She does not appear manic or psychotic.   Plan:  Schizoaffective disorder -Continue Latuda 60 mg daily -Prn Seroquel and Ativan  Pregnancy -US shows her  to be at 32.5 weeks. Heart tones being monitored and have been normal -She is setting up outpatient follow up in [facility] -CSW has been working very hard on safe discharge plan. She will be going to shelter for pregnant women in [place]. She also has found her car and will be picking this up in [place] and driving to [place].   Haskell Riling, MD 06/08/2017, 10:31 AM

## 2017-06-08 NOTE — Plan of Care (Signed)
Patient has verbalized understanding of the general information that's been provided to her and has not voiced any further questions or concerns at this time. Patient denies SI/HI/AVH as well as any signs/symptoms of depression/anxiety at this time. Patient states to this writer "I feel like I'm ready to go". Patient has the ability to verbalize her frustrations/anger appropriately and has been demonstrating self-control on the unit. Patient has identified the resources available to help assist her in meeting her health-care needs. Patient has been in compliance with her prescribed therapeutic plan. Patient has been free from injury thus far and remains safe on the unit at this time. Problem: Education: Goal: Knowledge of Jaime Washington General Education information/materials will improve Outcome: Progressing Goal: Emotional status will improve Outcome: Progressing Goal: Mental status will improve Outcome: Progressing Goal: Verbalization of understanding the information provided will improve Outcome: Progressing   Problem: Coping: Goal: Ability to verbalize frustrations and anger appropriately will improve Outcome: Progressing Goal: Ability to demonstrate self-control will improve Outcome: Progressing   Problem: Health Behavior/Discharge Planning: Goal: Identification of resources available to assist in meeting health care needs will improve Outcome: Progressing Goal: Compliance with treatment plan for underlying cause of condition will improve Outcome: Progressing   Problem: Safety: Goal: Periods of time without injury will increase Outcome: Progressing

## 2017-06-08 NOTE — Progress Notes (Signed)
FHR = 138, obtained by Ms Loreta Aveominique, OBGY RN.

## 2017-06-08 NOTE — [facility] Group Notes (Signed)
  LCSW Group Therapy Note  06/08/2017 1:00pm  Type of Therapy/Topic:  Group Therapy:  Emotion Regulation  Participation Level:  Active   Description of Group:   The purpose of this group is to assist patients in learning to regulate negative emotions and experience positive emotions. Patients will be guided to discuss ways in which they have been vulnerable to their negative emotions. These vulnerabilities will be juxtaposed with experiences of positive emotions or situations, and patients will be challenged to use positive emotions to combat negative ones. Special emphasis will be placed on coping with negative emotions in conflict situations, and patients will process healthy conflict resolution skills.  Therapeutic Goals: 1. Patient will identify two positive emotions or experiences to reflect on in order to balance out negative emotions 2. Patient will label two or more emotions that they find the most difficult to experience 3. Patient will demonstrate positive conflict resolution skills through discussion and/or role plays  Summary of Patient Progress:  Able to meet therapeutic goals.  Talked about the challenges of feeling confused. She talks about feeling hopeful in having a plan for where she is going.  Group discussed fear and worry about the unknown.   Therapeutic Modalities:   Cognitive Behavioral Therapy Feelings Identification Dialectical Behavioral Therapy   Jaime MacSara [name], LCSW 06/08/2017 5:17 PM

## 2017-06-08 NOTE — Progress Notes (Signed)
Patient slept for 3 hours this shift.

## 2017-06-08 NOTE — Progress Notes (Signed)
Patient ID: Inda MerlinShawnette Cherell Gavina, female   DOB: 06/11/1980, 37 y.o.   MRN: [id number] CSW made CPS report to [facility], [phone].  Report given to [facility].  CSW called to report concerns about Pt's children and not knowing who was caring for them as we were not given permission to call and follow up as we usually would.  They were able to find additional information in their system and will process report.  Jake SharkSara Joanie Duprey, LCSW

## 2017-06-09 MED ORDER — PRENATAL MULTIVITAMIN CH: 1.0000 | ORAL_TABLET | Freq: Every day | ORAL | 1 refills | Status: DC

## 2017-06-09 NOTE — Plan of Care (Signed)
Patient slept for Estimated Hours of 5; Precautionary checks every 15 minutes for safety maintained, room free of safety hazards, patient sustains no injury or falls during this shift.  Problem: Education: Goal: Knowledge of [place] [facility] Education information/materials will improve Outcome: Progressing Goal: Emotional status will improve Outcome: Progressing Goal: Mental status will improve Outcome: Progressing Goal: Verbalization of understanding the information provided will improve Outcome: Progressing   Problem: Coping: Goal: Ability to verbalize frustrations and anger appropriately will improve Outcome: Progressing Goal: Ability to demonstrate self-control will improve Outcome: Progressing   Problem: Health Behavior/Discharge Planning: Goal: Identification of resources available to assist in meeting health care needs will improve Outcome: Progressing Goal: Compliance with treatment plan for underlying cause of condition will improve Outcome: Progressing   Problem: Safety: Goal: Periods of time without injury will increase Outcome: Progressing

## 2017-06-09 NOTE — Progress Notes (Addendum)
Communicated with OB provider on-call, Beatriz Stallion. Jones, CNM, with [facility] in regards to the patient's complaint of contractions. Reported to C. Jones,CNM that HR dopplers were in the 120s with accelerations. Reported that contractions palpated mild and that patient did not appear to be in labor. Verbal order from Beatriz Stallion. Jones, CNM to let the patient and her nurse that if contractions are closer and stronger to call L&D. Also if there appeared to be vaginal bleeding or LOF to call L&D. Will call patient's RN (Abiodun) with the plan of care.

## 2017-06-09 NOTE — [facility] Group Notes (Signed)
LCSW Group Therapy Note 06/09/2017 9:00 AM  Type of Therapy and Topic:  Group Therapy:  Setting Goals  Participation Level:  Active  Description of Group: In this process group, patients discussed using strengths to work toward goals and address challenges.  Patients identified two positive things about themselves and one goal they were working on.  Patients were given the opportunity to share openly and support each other's plan for self-empowerment.  The group discussed the value of gratitude and were encouraged to have a daily reflection of positive characteristics or circumstances.  Patients were encouraged to identify a plan to utilize their strengths to work on current challenges and goals.  Therapeutic Goals 1. Patient will verbalize personal strengths/positive qualities and relate how these can assist with achieving desired personal goals 2. Patients will verbalize affirmation of peers plans for personal change and goal setting 3. Patients will explore the value of gratitude and positive focus as related to successful achievement of goals 4. Patients will verbalize a plan for regular reinforcement of personal positive qualities and circumstances.  Summary of Patient Progress:  Jaime Washington was very active in today's group.  She displayed a good understand of how to develop SMART goals and discussed with the group participants her successes and struggles she have had in developing her own personal goals.  Jaime Washington shared that her goal for today is "start writing in my journal again as a way to think about what goals I would like to start working on as well as record my thoughts."     Therapeutic Modalities Cognitive Behavioral Therapy Motivational Interviewing    Alease FrameSonya S Brennden Masten, LCSW 06/09/2017 2:04 PM

## 2017-06-09 NOTE — Progress Notes (Signed)
Patient ID: Jaime MerlinShawnette [name], female   DOB: 08/08/1980, 37 y.o.   MRN: [id number] No drama, no crying spells, mood and affect much better, initiated conversation and interacting well with peers, "baby is okay and I am okay..." Denies SI/HI/AVH, logical and coherent.

## 2017-06-09 NOTE — Progress Notes (Signed)
[facility] MD Progress Note  06/09/2017 1:52 PM Jaime Washington  MRN:  [id number]   Subjective:  Pt states that she is doing very well. Her thoughts process is much clearer. She is able to give details about her plan when she discharges. She is going to take a cab to her car and then use the directions to drive to [place] to [employer]. She states that she has an appointment on Tuesday for mental health and she is going to get her medicaid switched to [place] so she can make an OB appointment. She is going to get a phone over the weekend. She states taht she slept very well last night and racing thoughts are much improved. She denies AH or VH. She asks very appropriate questions about her medications and breast feeding. She asks if she can switch her prenatal vitamin to the time she takes her [place]. She is very rational and much less impulsive. She is very excited for the baby. She adamantly denies SI or thoughts of harm the baby. She has been very calm and pleasant on the unit.   Principal Problem: Schizoaffective disorder (HCC) Diagnosis:   Patient Active Problem List   Diagnosis Date Noted  . Schizoaffective disorder (HCC) [F25.9] 06/03/2017    Priority: High   Total Time spent with patient: 20 minutes  Past Psychiatric History: See H&P  Past Medical History:  Past Medical History:  Diagnosis Date  . Bipolar disorder (HCC)   . Schizophrenia, acute (HCC)    History reviewed. No pertinent surgical history. Family History: History reviewed. No pertinent family history. Family Psychiatric  History: See H&P Social History:  Social History   Substance and Sexual Activity  Alcohol Use Not on file     Social History   Substance and Sexual Activity  Drug Use Not on file    Social History   Socioeconomic History  . Marital status: Single    Spouse name: Not on file  . Number of children: Not on file  . Years of education: Not on file  . Highest education level: Not  on file  Occupational History  . Not on file  Social Needs  . Financial resource strain: Not on file  . Food insecurity:    Worry: Not on file    Inability: Not on file  . Transportation needs:    Medical: Not on file    Non-medical: Not on file  Tobacco Use  . Smoking status: Never Smoker  . Smokeless tobacco: Never Used  Substance and Sexual Activity  . Alcohol use: Not on file  . Drug use: Not on file  . Sexual activity: Not on file  Lifestyle  . Physical activity:    Days per week: Not on file    Minutes per session: Not on file  . Stress: Not on file  Relationships  . Social connections:    Talks on phone: Not on file    Gets together: Not on file    Attends religious service: Not on file    Active member of club or organization: Not on file    Attends meetings of clubs or organizations: Not on file    Relationship status: Not on file  Other Topics Concern  . Not on file  Social History Narrative  . Not on file   Additional Social History:  Sleep: Good  Appetite:  Good  Current Medications: Current Facility-Administered Medications  Medication Dose Route Frequency Provider Last Rate Last Dose  . acetaminophen (TYLENOL) tablet 650 mg  650 mg Oral Q6H PRN Pucilowska, Jolanta B, MD      . hydrocortisone cream 0.5 %   Topical Daily PRN Haskell Riling, MD   1 application at 06/09/17 1223  . LORazepam (ATIVAN) tablet 2 mg  2 mg Oral Q6H PRN Lillymae Duet R, MD      . lurasidone (LATUDA) tablet 60 mg  60 mg Oral QPC supper Darliss Ridgel, MD   60 mg at 06/08/17 1756  . neomycin-bacitracin-polymyxin (NEOSPORIN) ointment   Topical Daily PRN River Mckercher, Ileene Hutchinson, MD      . prenatal multivitamin tablet 1 tablet  1 tablet Oral Q1200 Pucilowska, Jolanta B, MD   1 tablet at 06/09/17 1223  . QUEtiapine (SEROQUEL) tablet 50 mg  50 mg Oral TID PRN Tyger Oka, Ileene Hutchinson, MD        Lab Results: No results found for this or any previous visit (from the past  48 hour(s)).  Blood Alcohol level:  Lab Results  Component Value Date   ETH <10 06/03/2017    Metabolic Disorder Labs: Lab Results  Component Value Date   HGBA1C 5.7 (H) 06/04/2017   MPG 116.89 06/04/2017   No results found for: PROLACTIN Lab Results  Component Value Date   CHOL 236 (H) 06/04/2017   TRIG 130 06/04/2017   HDL 61 06/04/2017   CHOLHDL 3.9 06/04/2017   VLDL 26 06/04/2017   LDLCALC 149 (H) 06/04/2017    Physical Findings: AIMS: Facial and Oral Movements Muscles of Facial Expression: None, normal Lips and Perioral Area: None, normal Jaw: None, normal Tongue: None, normal,Extremity Movements Upper (arms, wrists, hands, fingers): None, normal Lower (legs, knees, ankles, toes): None, normal, Trunk Movements Neck, shoulders, hips: None, normal, Overall Severity Severity of abnormal movements (highest score from questions above): None, normal Incapacitation due to abnormal movements: None, normal Patient's awareness of abnormal movements (rate only patient's report): No Awareness, Dental Status Current problems with teeth and/or dentures?: No Does patient usually wear dentures?: No  CIWA:    COWS:     Musculoskeletal: Strength & Muscle Tone: within normal limits Gait & Station: normal Patient leans: N/A  Psychiatric Specialty Exam: Physical Exam  ROS  Blood pressure (!) 106/47, pulse 88, temperature 98.1 F (36.7 C), temperature source Oral, resp. rate 16, height 5\' 4"  (1.626 m), weight 121.6 kg (268 lb), SpO2 99 %.Body mass index is 46 kg/m.  General Appearance: Casual  Eye Contact:  Good  Speech:  Clear and Coherent  Volume:  Normal  Mood:  Euthymic  Affect:  Appropriate  Thought Process:  Coherent and Goal Directed  Orientation:  Full (Time, Place, and Person)  Thought Content:  Logical  Suicidal Thoughts:  No  Homicidal Thoughts:  No  Memory:  Immediate;   Fair  Judgement:  Fair improved  Insight:  Fair  Psychomotor Activity:  Normal   Concentration:  Concentration: Fair  Recall:  [employer] of Knowledge:  Fair  Language:  Fair  Akathisia:  No      Assets:  Resilience  ADL's:  Intact  Cognition:  WNL  Sleep:  Number of Hours: 5     Treatment Plan Summary: 37 yo female admitted due to psychosis. Thought processes are much clearer and she is able to think very rationally and process through her plan. She  worked very well with the CSW in making discharge plans. She was able to make several phone calls to set up appointments and find her car. She slept much better last night. Her mood is bright and cheerful. She is very calm and pleasant.   Plan:  Schizoaffective disorder -Continue Latuda 60 mg daily  Pregnancy -No complications, She feels well --US shows her to be at 32.5 weeks. Heart tones being monitored and have been normal -[facility] -She will discharge tomorrow. She will be given cab to [place] to get her car and then drive to [place] to live. She has set up appointments for follow up    Haskell RilingHolly R Jaleya Pebley, MD 06/09/2017, 1:52 PM

## 2017-06-09 NOTE — Progress Notes (Signed)
Fetal heart tones in the mid 120s with accelerations in the 150s. Patient reports to having contractions throughout the day and recently feels like contractions are closer together. Patient could not tell RN exactly how close the are. By palpation, contractions are mild every 3 mins apart. Patient rating pain a 8 out of 10 in the mid to lower abdomen. Denies vaginal bleeding. Denies LOF. Reports positive FM. Will notify OB provider on-call.

## 2017-06-09 NOTE — Progress Notes (Signed)
D- Patient alert and oriented. Patient presents in a pleasant mood on assessment stating that she slept good last night reporting to this writer "I'm feeling a lot better today". Patient denies any signs/symptoms of depression/anxiety stating "I'm looking forward to leaving tomorrow". Patient denies SI, HI, AVH, and pain at this time. Patient's goal for today is "continue going to groups and communicating with my health care team".  A- Scheduled medications administered to patient, per MD orders. Support and encouragement provided.  Routine safety checks conducted every 15 minutes.  Patient informed to notify staff with problems or concerns.  R- No adverse drug reactions noted. Patient contracts for safety at this time. Patient compliant with medications and treatment plan. Patient receptive, calm, and cooperative. Patient interacts well with others on the unit.  Patient remains safe at this time.

## 2017-06-09 NOTE — Plan of Care (Signed)
Patient has verbalized understanding of the general information that's been provided to her and has not voiced any further questions or concerns at this time. Patient denies SI/HI/AVH as well as any signs/symptoms of depression/anxiety at this time stating to this writer "I'm feeling much better, I'm looking forward to leaving tomorrow". Patient has the ability to verbalize her frustrations/anger appropriately and has been demonstrating self-control on the unit. Patient has identified the resources available to help assist her in meeting her health-care needs. Patient has been in compliance with her prescribed therapeutic/medication plan. Patient has been free from injury thus far and remains safe on the unit at this time   Problem: Education: Goal: Knowledge of [place] [facility] Education information/materials will improve Outcome: Progressing Goal: Emotional status will improve Outcome: Progressing Goal: Mental status will improve Outcome: Progressing Goal: Verbalization of understanding the information provided will improve Outcome: Progressing   Problem: Coping: Goal: Ability to verbalize frustrations and anger appropriately will improve Outcome: Progressing Goal: Ability to demonstrate self-control will improve Outcome: Progressing   Problem: Health Behavior/Discharge Planning: Goal: Identification of resources available to assist in meeting health care needs will improve Outcome: Progressing Goal: Compliance with treatment plan for underlying cause of condition will improve Outcome: Progressing   Problem: Safety: Goal: Periods of time without injury will increase Outcome: Progressing

## 2017-06-09 NOTE — Progress Notes (Signed)
Recreation Therapy Notes  Date: 06/09/2017  Time: 9:30 am  Location: Craft Room  Behavioral response: Appropriate  Intervention Topic: Self-Care  Discussion/Intervention:  Group content today was focused on Self-Care. The group defined self-care and some positive ways they care for themselves. Individuals expressed ways and reasons why they neglected any self-care in the past. Patients described ways to improve self-care in the future. The group explained what could happen if they did not do any self-care activities at all. The group participated in the intervention "self-care assessment" where they had a chance to discover some of their weaknesses and strengths in self- care. Patient came up with a self-care plan to improve themselves in the future.   Clinical Observations/Feedback:  Patient came to group and defined self-care as taking care of self. She stated her self-care depends on her mind frame.Individual stated she tries her best when it come to self-care. Patient stated if she was more structured on decisions, her life would be a lot better. Individual participated in the intervention and was social with peers and staff during group.  Nikea Settle LRT/CTRS         Latif Nazareno 06/09/2017 10:58 AM

## 2017-06-09 NOTE — [facility] Group Notes (Signed)
06/09/2017  Time: 1PM  Type of Therapy/Topic:  Group Therapy:  Balance in Life  Participation Level:  Active  Description of Group:   This group will address the concept of balance and how it feels and looks when one is unbalanced. Patients will be encouraged to process areas in their lives that are out of balance and identify reasons for remaining unbalanced. Facilitators will guide patients in utilizing problem-solving interventions to address and correct the stressor making their life unbalanced. Understanding and applying boundaries will be explored and addressed for obtaining and maintaining a balanced life. Patients will be encouraged to explore ways to assertively make their unbalanced needs known to significant others in their lives, using other group members and facilitator for support and feedback.  Therapeutic Goals: 1. Patient will identify two or more emotions or situations they have that consume much of in their lives. 2. Patient will identify signs/triggers that life has become out of balance:  3. Patient will identify two ways to set boundaries in order to achieve balance in their lives:  4. Patient will demonstrate ability to communicate their needs through discussion and/or role plays  Summary of Patient Progress: Pt continues to work towards their tx goals but has not yet reached them. Pt was able to appropriately participate in group discussion, and was able to offer support/validation to other group members. Pt reported she is feeling, "upbeat because I'm focusing on what I need to today." Pt reported one area of her life she would like to devote more attention to is, "fun and hobbies." Pt reported one area of her life she thinks is going well is, "spirituality."   Therapeutic Modalities:   Cognitive Behavioral Therapy Solution-Focused Therapy Assertiveness Training  Heidi DachKelsey Denaisha Swango, MSW, LCSW Clinical Social Worker 06/09/2017 1:54 PM

## 2017-06-09 NOTE — [facility] Group Notes (Signed)
[facility] Group Notes:  (Nursing/MHT/Case Management/Adjunct)  Date:  06/09/2017  Time:  9:10 PM  Type of Therapy:  Group Therapy  Participation Level:  Active  Participation Quality:  Appropriate  Affect:  Appropriate  Cognitive:  Alert  Insight:  Good  Engagement in Group:  Engaged  Modes of Intervention:  Support  Summary of Progress/Problems:  Jaime Washington 06/09/2017, 9:10 PM

## 2017-06-10 ENCOUNTER — Encounter: Payer: Self-pay | Admitting: Behavioral Health

## 2017-06-10 ENCOUNTER — Other Ambulatory Visit: Payer: Self-pay

## 2017-06-10 ENCOUNTER — Observation Stay
Admission: EM | Admit: 2017-06-10 | Discharge: 2017-06-10 | Disposition: A | Discharge status: Other Acute Inpatient Hospital | Payer: Medicare Other | Attending: Obstetrics & Gynecology | Admitting: Obstetrics & Gynecology

## 2017-06-10 ENCOUNTER — Inpatient Hospital Stay
Admission: AD | Admit: 2017-06-10 | Discharge: 2017-06-10 | DRG: 833 | Disposition: A | Discharge status: Home / Self Care | Payer: Medicare Other | Source: Intra-hospital | Attending: Psychiatry | Admitting: Psychiatry

## 2017-06-10 DIAGNOSIS — Z8659 Personal history of other mental and behavioral disorders: Secondary | ICD-10-CM

## 2017-06-10 DIAGNOSIS — Z3A32 32 weeks gestation of pregnancy: Secondary | ICD-10-CM

## 2017-06-10 DIAGNOSIS — O479 False labor, unspecified: Principal | ICD-10-CM | POA: Insufficient documentation

## 2017-06-10 DIAGNOSIS — Z91013 Allergy to seafood: Secondary | ICD-10-CM

## 2017-06-10 DIAGNOSIS — O99343 Other mental disorders complicating pregnancy, third trimester: Secondary | ICD-10-CM | POA: Insufficient documentation

## 2017-06-10 DIAGNOSIS — Z880 Allergy status to penicillin: Secondary | ICD-10-CM | POA: Insufficient documentation

## 2017-06-10 DIAGNOSIS — Z79899 Other long term (current) drug therapy: Secondary | ICD-10-CM

## 2017-06-10 DIAGNOSIS — Z91040 Latex allergy status: Secondary | ICD-10-CM | POA: Insufficient documentation

## 2017-06-10 DIAGNOSIS — F319 Bipolar disorder, unspecified: Secondary | ICD-10-CM | POA: Insufficient documentation

## 2017-06-10 DIAGNOSIS — Z3A34 34 weeks gestation of pregnancy: Secondary | ICD-10-CM | POA: Insufficient documentation

## 2017-06-10 DIAGNOSIS — F259 Schizoaffective disorder, unspecified: Secondary | ICD-10-CM | POA: Diagnosis present

## 2017-06-10 DIAGNOSIS — F209 Schizophrenia, unspecified: Secondary | ICD-10-CM | POA: Insufficient documentation

## 2017-06-10 DIAGNOSIS — Z590 Homelessness: Secondary | ICD-10-CM

## 2017-06-10 DIAGNOSIS — Z3490 Encounter for supervision of normal pregnancy, unspecified, unspecified trimester: Secondary | ICD-10-CM

## 2017-06-10 DIAGNOSIS — F250 Schizoaffective disorder, bipolar type: Secondary | ICD-10-CM

## 2017-06-10 MED ORDER — PRENATAL MULTIVITAMIN CH: 1.0000 | ORAL_TABLET | Freq: Every day | ORAL | 1 refills | Status: AC

## 2017-06-10 MED ORDER — LURASIDONE HCL 40 MG PO TABS
60.0000 mg | ORAL_TABLET | Freq: Every day | ORAL | Status: DC
  Filled 2017-06-10: qty 2

## 2017-06-10 MED ORDER — LURASIDONE HCL 60 MG PO TABS: 60.0000 mg | ORAL_TABLET | Freq: Every day | ORAL | 1 refills | Status: AC

## 2017-06-10 MED ORDER — PRENATAL MULTIVITAMIN CH
1.0000 | ORAL_TABLET | Freq: Every day | ORAL | Status: DC
  Administered 2017-06-10: 1 via ORAL
  Filled 2017-06-10: qty 1

## 2017-06-10 NOTE — Progress Notes (Signed)
Patient ID: Inda MerlinShawnette Cherell Sabey, female   DOB: 08/29/1980, 37 y.o.   MRN: [id number] Anxious about anticipated discharge tomorroe, restless, preoccupied, fixated on her pregnancy, claimed that she's unusually having labor symptoms: contractions, FHR-fetal heart rate sporadically "bouncing" .Marland Kitchen...requested assessment of FHR, refused to go back to room, began to perseverate, ext [phone] was called twice but patient continues to complain and at the verge of tears. [facility] and talked with Ms Renea Eevelyn, RN, who came to assess contractions and FHR (please see her note). She explained to patient that she would get Ms Yetta BarreJones, CNM involved and get back with us.  Feedback: (see Ms Renea Eevelyn, RN notes). Patient was not satisfied and wanted to be examined and monitored. [facility] was [facility], [facility], examined, monitored and re-admitted back to the unit.  Note Significant amount of time and resources were expended by OA, Unit Staffs and [facility] staff[facility].

## 2017-06-10 NOTE — Progress Notes (Signed)
  [facility] Case [facility] Discharge Plan :  Will you be returning to the same living situation after discharge:  No. At discharge, do you have transportation home?: Yes,  Pt will be transported via taxi to pick up her car. Do you have the ability to pay for your medications: Yes,  Pt will be supported with her medications via insurance.  Release of information consent forms completed and in the chart;  Patient's signature needed at discharge.  Patient to Follow up at: Follow-up Information    [facility] Follow up on 06/14/2017.   Why:  Your appointment is scheduled for 06/14/17.  Please arrive at 10:30 AM to complete paperwork.  Please bring your ID, hospital discharge paperwork, and insurance information.  Thank you. Contact information: [address] [place], [place]  [id number] [phone] phone [phone] fax        [facility] Follow up on 06/10/2017.   Why:  You will need to be there on 06/10/17 between 3:30 PM and 4:00 PM for your intake appointment. Contact information: [address] [place], [place]  [id number] [phone] phone       [facility] Follow up.   Why:  They will be able to schedule an initial appointment for you AFTER you fill out your Medicaid application.  You will need to complete the application at [facility] located at [address], [place], [place]  [id number]. Ph is [phone]. Contact information: [address] [place], [place]  [id number] [phone] phone [phone] fax          Next level of care provider has access to [facility] Link:no  Safety Planning and Suicide Prevention discussed: Yes,  No safety issues identified.     Has patient been referred to the Quitline?: N/A patient is not a smoker  Patient has been referred for addiction treatment: N/A  Alease FrameSonya S Caedin Mogan, LCSW 06/10/2017, 9:51 AM

## 2017-06-10 NOTE — Progress Notes (Signed)
Abiodun, RN notified of plan of care given by Beatriz Stallion. Jones, CNM. It was communicated to notify labor and delivery if it seemed if patient's contractions are stronger (patient increasingly uncomfortable), vaginal bleeding, or LOF to notify labor and delivery. Mr. Colon Branchbiodun, RN agreed with plan of care.

## 2017-06-10 NOTE — Progress Notes (Signed)
Jaime Washington is a 37 y.o. female. She is at 8518w1d gestation. No LMP recorded. Patient is pregnant. Estimated Date of Delivery: 07/21/17 given to pt.   Prenatal care site: [place] in [place] but, pt does not recall the name   Chief complaint:Feels UC's in abd  Location: Abd  contractions Onset/timing: Baby is balling up at intervals and pt thinks these are contractions Duration:lasting seconds  Quality: sharp Severity: mild Aggravating or alleviating conditions:Bipolar/Schizoaffective disorder Associated signs/symptoms: No LOF, no VB or decreased FM Context:N/A  S: Resting comfortably. no CTX, no VB.no LOF,  Active fetal movement. Pt is suspicious of CNM asking questions about her history.   Maternal Medical History:   Past Medical History:  Diagnosis Date  . Bipolar disorder (HCC)   . Schizophrenia, acute (HCC)     History reviewed. No pertinent surgical history.  Allergies  Allergen Reactions  . Amoxicillin Itching  . Shellfish Allergy Hives  . Latex Rash    Prior to Admission medications   Medication Sig Start Date End Date Taking? Authorizing Provider  Lurasidone HCl 60 MG TABS Take 1 tablet (60 mg total) by mouth daily with breakfast. 06/08/17   McNew, Ileene HutchinsonHolly R, MD  Prenatal Vit-Fe Fumarate-FA (PRENATAL MULTIVITAMIN) TABS tablet Take 1 tablet by mouth daily at 12 noon. 06/10/17   McNew, Ileene HutchinsonHolly R, MD     Social History: She  reports that she has never smoked. She has never used smokeless tobacco. She reports that she does not drink alcohol or use drugs.  Family History: family history includes Cancer in her maternal grandfather; Diabetes in her maternal grandfather and maternal grandmother. no history of gyn cancers  Review of Systems: A full review of systems was performed and negative except as noted in the HPI.     O:  BP 118/66 (BP Location: Left Arm)   Pulse 69   Temp 98 F (36.7 C) (Oral)   Resp 18  No results found for this or any previous  visit (from the past 48 hour(s)).   Constitutional: NAD, AAOx3  HE/ENT: extraocular movements grossly intact, moist mucous membranes CV: RRR, S1S2, no M/R/G.  PULM: nl respiratory effort, CTABL     Abd: gravid, non-tender, non-distended, soft      Ext: Non-tender, Nonedmeatous   Psych: mood appropriate, speech normal Pelvic deferred  NST: Reactive with 2 accels 15 x 15 BPM Baseline: 135 Variability: moderate Accelerations present x >2 Decelerations absent Time 20mins    A/P: 37 y.o. 9218w1d here for antenatal surveillance for fetal wellbeing and labor ruled out  Labor: not present.   Fetal Wellbeing: Reassuring Cat 1 tracing.  Reactive NST   Schizoaffective Disorder/Bipolar in Behavioral health [facility]  ----- Myrtie Cruisearon W. Jones,RN, MSN, CNM, FNP Certified Nurse Midwife [facility]

## 2017-06-10 NOTE — Discharge Summary (Signed)
Physician Discharge Summary Note  Patient:  Jaime Washington is an 37 y.o., female MRN:  [id number] DOB:  1981/01/24 Patient phone:  [phone] (home)  Patient address:   [address] [place] [address],  Total Time spent with patient: 30 minutes  Plus 20 minutes of medication reconciliation, discharge planning, and discharge documentation   Date of Admission:  06/10/2017 Date of Discharge: 06/10/17  Reason for Admission:  Jaime Washington is a 37 year old single African-American female originally from [place], [place] he was involuntarily committed by [facility] and transferred to [facility] secondary to psychosis. The patient says that she drove her car to [place] 2 days ago because she wanted to "sightsee". She then called the police from a convenience store because she felt like she needed to be on her medication. She says she was recently hospitalized psychiatrically in [place] 2 days before coming to [place] for approximately 1 day. She cannot remember the name of the hospital that she was admitted to. She does know that she was on Latuda 40 mg by mouth daily with dinner for some time even prior to her hospitalization. She believes that she was given a prescription for Zyprexa hospital but did not fill it. She denies any current active or passive suicidal thoughts and says she does not want to hurt her child but is endorsing auditory hallucinations of voices repeating events in her childhood that were uncomfortable for her. She denies that she was sexually or physically abused however. She says she was led by the spirits to [place] and the voices told her to come here. She does endorse racing thoughts but no grandiose delusions or increased goal-directed behavior. She denies any hyperreligiosity. She does appear to have some thought blocking and was unable to answer all questions during the session. She says she gets very easily  overwhelmed and has a difficult time remembering events in [place]. She was alert and oriented to time and gave the correct date is 06/03/2017. The patient was somewhat confused about being in the hospital and did not know the name of the hospital. She denied having any visual hallucinations. She did report depressive symptoms but cannot identify triggers for depression. She stated that she was excited for the baby and did want to have a child but currently endorses homelessness. She says she now wants to settle in [place]. She says the father of the baby is not in their life. She says she lives in [place], alone and is unemployed. She could not give any contact information for family and [place] but stated that she had one brother and one sister in [place]. Her parents are both deceased. She denies any history of any heavy alcohol use or illicit drug use. Toxicology screen was negative in the emergency room. The patient was a poor historian. No collateral information could be obtained from records through care everywhere.    Principal Problem: <principal problem not specified> Discharge Diagnoses: Patient Active Problem List   Diagnosis Date Noted  . Schizoaffective disorder (HCC) [F25.9] 06/03/2017    Priority: High  . Pregnancy [Z34.90] 06/10/2017    Past Psychiatric History: See H&P  Past Medical History:  Past Medical History:  Diagnosis Date  . Bipolar disorder (HCC)   . Schizophrenia, acute (HCC)    No past surgical history on file. Family History:  Family History  Problem Relation Age of Onset  . Diabetes Maternal Grandmother   . Diabetes Maternal Grandfather   .  Cancer Maternal Grandfather    Family Psychiatric  History: See H&P Social History:  Social History   Substance and Sexual Activity  Alcohol Use Never  . Frequency: Never     Social History   Substance and Sexual Activity  Drug Use Never    Social History   Socioeconomic History  . Marital  status: Single    Spouse name: Not on file  . Number of children: Not on file  . Years of education: Not on file  . Highest education level: Not on file  Occupational History  . Not on file  Social Needs  . Financial resource strain: Not on file  . Food insecurity:    Worry: Not on file    Inability: Not on file  . Transportation needs:    Medical: Not on file    Non-medical: Not on file  Tobacco Use  . Smoking status: Never Smoker  . Smokeless tobacco: Never Used  Substance and Sexual Activity  . Alcohol use: Never    Frequency: Never  . Drug use: Never  . Sexual activity: Not Currently  Lifestyle  . Physical activity:    Days per week: Not on file    Minutes per session: Not on file  . Stress: Not on file  Relationships  . Social connections:    Talks on phone: Not on file    Gets together: Not on file    Attends religious service: Not on file    Active member of club or organization: Not on file    Attends meetings of clubs or organizations: Not on file    Relationship status: Not on file  Other Topics Concern  . Not on file  Social History Narrative  . Not on file    Hospital Course:  Pt was restarted on Latuda and increased to 60 mg daily. She refused to change this medication due to pregnancy. OB was consulted and performed US and found her to be at 32.[redacted] weeks gestation. Fetal heart tones monitored through hospitalization. Pt was calm on the unit. Thought process cleared significantly and she was able to give much better history. I was able to speak with her therapist in [place] for additional information which was very helpful. PT was medication compliant. She worked very well with CSW on setting up safe discharge plan and was able to do most of this on her own. On day of discharge, she was feeling much better. She was able to process through her plan after the hospital. She was very organized in thoughts. She had brighter affect. She did not appear manic or  psychotic. She asked appropriately questions about her medications and plans to continue Latuda and prenatal vitamins. She adamantly denied SI or thoughts to harm the baby. Denied AH, VH. She was given 7 day supply of medications. She requested discharge and no longer meets [facility] IVC criteria.   The patient is at low risk of imminent suicide. Patient denied thoughts, intent, or plan for harm to self or others, expressed significant future orientation, and expressed an ability to mobilize assistance for her needs. She is presently void of any contributing psychiatric symptoms, cognitive difficulties, or substance use which would elevate her risk for lethality. Chronic risk for lethality is elevated in light of impulsivity, history of missing doses of medications, fair to poor insight at times. The chronic risk is presently mitigated by her ongoing desire and engagement in [facility] treatment.. Chronic risk may elevate if she experiences any significant  loss or worsening of symptoms, which can be managed and monitored through outpatient providers. She has been able to seek mental health help through calling the police or presenting to the ED when she feels unsafe or needs help.  At this time,a cute risk for lethality is low and she is stable for ongoing outpatient management.   Modifiable risk factors were addressed during this hospitalization through appropriate pharmacotherapy and establishment of outpatient follow-up treatment. Some risk factors for suicide are situational (i.e. Unstable housing) or related personality pathology (i.e. Poor coping mechanisms) and thus cannot be further mitigated by continued hospitalization in this setting.    Physical Findings: AIMS:  , ,  ,  ,    CIWA:    COWS:     Musculoskeletal: Strength & Muscle Tone: within normal limits Gait & Station: normal Patient leans: N/A  Psychiatric Specialty Exam: Physical Exam  ROS  There were no vitals taken for this visit.There is no  height or weight on file to calculate BMI.  General Appearance: Casual  Eye Contact:  Good  Speech:  Clear and Coherent  Volume:  Normal  Mood:  Euthymic  Affect:  Congruent  Thought Process:  Coherent and Goal Directed  Orientation:  Full (Time, Place, and Person)  Thought Content:  Logical  Suicidal Thoughts:  No  Homicidal Thoughts:  No  Memory:  Immediate;   Fair  Judgement:  Fair  Insight:  Fair  Psychomotor Activity:  Normal  Concentration:  Concentration: Fair  Recall:  Fair  Fund of Knowledge:  Fair  Language:  Fair  Akathisia:  No      Assets:  Resilience  ADL's:  Intact  Cognition:  WNL  Sleep:  Number of Hours: 0.5        Has this patient used any form of tobacco in the last 30 days? (Cigarettes, Smokeless Tobacco, Cigars, and/or Pipes) No  Blood Alcohol level:  Lab Results  Component Value Date   ETH <10 06/03/2017    Metabolic Disorder Labs:  Lab Results  Component Value Date   HGBA1C 5.7 (H) 06/04/2017   MPG 116.89 06/04/2017   No results found for: PROLACTIN Lab Results  Component Value Date   CHOL 236 (H) 06/04/2017   TRIG 130 06/04/2017   HDL 61 06/04/2017   CHOLHDL 3.9 06/04/2017   VLDL 26 06/04/2017   LDLCALC 149 (H) 06/04/2017    See Psychiatric Specialty Exam and Suicide Risk Assessment completed by Attending Physician prior to discharge.  Discharge destination:  Other:  Shelter for pregnant women in [place] who are expecting her today  Is patient on multiple antipsychotic therapies at discharge:  No   Has Patient had three or more failed trials of antipsychotic monotherapy by history:  No  Recommended Plan for Multiple Antipsychotic Therapies: NA  Discharge Instructions    Increase activity slowly   Complete by:  As directed      Allergies as of 06/10/2017      Reactions   Amoxicillin Itching   Shellfish Allergy Hives   Latex Rash      Medication List    TAKE these medications     Indication  Lurasidone HCl 60 MG  Tabs Take 1 tablet (60 mg total) by mouth daily with breakfast.  Indication:  Schizoaffective disorder   prenatal multivitamin Tabs tablet Take 1 tablet by mouth daily at 12 noon.  Indication:  Pregnancy        Follow-up recommendations: [facility], [facility] for [facility] and [facility] on 4/16 for mental health  Signed: Haskell RilingHolly R Soraiya Ahner, MD 06/10/2017, 9:43 AM

## 2017-06-10 NOTE — Plan of Care (Signed)
Patient slept for Estimated Hours of half an hour; Precautionary checks every 15 minutes for safety maintained, room free of safety hazards, patient sustains no injury or falls during this shift.  Problem: Activity: Goal: Sleeping patterns will improve Outcome: Not Progressing

## 2017-06-10 NOTE — [facility] Suicide Risk Assessment (Signed)
[facility]   Principal Problem: Psychosis Discharge Diagnoses:  Patient Active Problem List   Diagnosis Date Noted  . Schizoaffective disorder (HCC) [F25.9] 06/03/2017    Priority: High  . Pregnancy [Z34.90] 06/10/2017    Mental Status Per Nursing Assessment::   On Admission:     Demographic Factors:  Living alone and Unemployed  Loss Factors: NA  Historical Factors: Impulsivity  Risk Reduction Factors:   Pregnancy, Sense of responsibility to family, Positive social support and Positive therapeutic relationship  Continued Clinical Symptoms:  Previous Psychiatric Diagnoses and Treatments  Cognitive Features That Contribute To Risk:  None    Suicide Risk:  Minimal: No identifiable suicidal ideation.     Plan Of Care/Follow-up recommendations: [facility]  Deondray Ospina R Alyscia Carmon, MD 06/10/2017, 9:07 AM

## 2017-06-10 NOTE — Progress Notes (Signed)
Patient to transfer back to behavorial med

## 2017-06-10 NOTE — Discharge Summary (Signed)
Physician Discharge Summary Note  Patient:  Jaime Washington is an 37 y.o., female MRN:  [id number] DOB:  07/15/1980 Patient phone:  [phone] (home)  Patient address:   [address] [place] [id number],    Date of Admission:  06/03/2017 Date of Discharge: 06/10/17  Reason for Admission:  psyschosis  Principal Problem: Schizoaffective disorder [facility](HCC) Discharge Diagnoses: Patient Active Problem List   Diagnosis Date Noted  . Schizoaffective disorder (HCC) [F25.9] 06/03/2017    Priority: High  . Pregnancy [Z34.90] 06/10/2017    Past Psychiatric History: See H&P  Past Medical History:  Past Medical History:  Diagnosis Date  . Bipolar disorder (HCC)   . Schizophrenia, acute (HCC)    History reviewed. No pertinent surgical history. Family History:  Family History  Problem Relation Age of Onset  . Diabetes Maternal Grandmother   . Diabetes Maternal Grandfather   . Cancer Maternal Grandfather    Family Psychiatric  History: See H&P Social History:  Social History   Substance and Sexual Activity  Alcohol Use Never  . Frequency: Never     Social History   Substance and Sexual Activity  Drug Use Never    Social History   Socioeconomic History  . Marital status: Single    Spouse name: Not on file  . Number of children: Not on file  . Years of education: Not on file  . Highest education level: Not on file  Occupational History  . Not on file  Social Needs  . Financial resource strain: Not on file  . Food insecurity:    Worry: Not on file    Inability: Not on file  . Transportation needs:    Medical: Not on file    Non-medical: Not on file  Tobacco Use  . Smoking status: Never Smoker  . Smokeless tobacco: Never Used  Substance and Sexual Activity  . Alcohol use: Never    Frequency: Never  . Drug use: Never  . Sexual activity: Not Currently  Lifestyle  . Physical activity:    Days per week: Not on file    Minutes per session: Not on file   . Stress: Not on file  Relationships  . Social connections:    Talks on phone: Not on file    Gets together: Not on file    Attends religious service: Not on file    Active member of club or organization: Not on file    Attends meetings of clubs or organizations: Not on file    Relationship status: Not on file  Other Topics Concern  . Not on file  Social History Narrative  . Not on file    Hospital Course:  Pt was discharged to Ob/gyn observation unit for monitoring of possible contractions. She will be returning back to behavioral medicine when medically cleared.   Physical Findings: AIMS: Facial and Oral Movements Muscles of Facial Expression: None, normal Lips and Perioral Area: None, normal Jaw: None, normal Tongue: None, normal,Extremity Movements Upper (arms, wrists, hands, fingers): None, normal Lower (legs, knees, ankles, toes): None, normal, Trunk Movements Neck, shoulders, hips: None, normal, Overall Severity Severity of abnormal movements (highest score from questions above): None, normal Incapacitation due to abnormal movements: None, normal Patient's awareness of abnormal movements (rate only patient's report): No Awareness, Dental Status Current problems with teeth and/or dentures?: No Does patient usually wear dentures?: No  CIWA:    COWS:       Have you used any form of tobacco in the  last 30 days? (Cigarettes, Smokeless Tobacco, Cigars, and/or Pipes): No  Has thNo  Blood Alcohol level:  Lab Results  Component Value Date   ETH <10 06/03/2017    Metabolic Disorder Labs:  Lab Results  Component Value Date   HGBA1C 5.7 (H) 06/04/2017   MPG 116.89 06/04/2017   No results found for: PROLACTIN Lab Results  Component Value Date   CHOL 236 (H) 06/04/2017   TRIG 130 06/04/2017   HDL 61 06/04/2017   CHOLHDL 3.9 06/04/2017   VLDL 26 06/04/2017   LDLCALC 149 (H) 06/04/2017    See Psychiatric Specialty Exam and Suicide Risk Assessment completed by  Attending Physician prior to discharge.  Discharge destination:  Other:  OB/GYN obs unit  Is patient on multiple antipsychotic therapies at discharge:  No   Has Patient had three or more failed trials of antipsychotic monotherapy by history:  No  Recommended Plan for Multiple Antipsychotic Therapies: NA   Allergies as of 06/10/2017      Reactions   Amoxicillin Itching   Shellfish Allergy Hives   Latex Rash      Medication List    STOP taking these medications   lurasidone 40 MG Tabs tablet Commonly known as:  LATUDA   OLANZapine 10 MG tablet Commonly known as:  ZYPREXA      Follow-up Information    [facility]. Go on 06/10/2017.   Why:  3:30/4pm for intake into their program.  Please bring hospital paperwork for proof of Pregnancy. Contact information: [address] [place] [address] [phone]       [facility]. Go on 06/14/2017.   Why:  Tuesday 11:00am, please arrive at 10:30am to complete paperwork and bring ID, hospital paperwork, and insurance information.  Contact information: [address] [place] [address] [place] [address] [phone] [phone]       [facility]. Schedule an appointment as soon as possible for a visit.   Why:  They would be able to schedule an appointment with you AFTER filling out a [place] Medicaid application through Department of Social Services- [address] [place], [place] [address], [phone] Contact information: Starla Link [address] [place] [address] [phone] [phone]           Signed: Haskell Riling, MD 06/10/2017, 11:45 AM

## 2017-06-10 NOTE — H&P (Signed)
Psychiatric Admission Assessment Adult  Patient Identification: Jaime MerlinShawnette [name] MRN:  [id number] Date of Evaluation:  06/10/2017 Chief Complaint:  schizoaffective disorder Principal Diagnosis: Schizoaffective disorder (HCC) Diagnosis:   Patient Active Problem List   Diagnosis Date Noted  . Schizoaffective disorder (HCC) [F25.9] 06/03/2017    Priority: High  . Pregnancy [Z34.90] 06/10/2017   History of Present Illness:36 yo female with history of schizoaffective disorder readmitted after being transfer to OB/GYN briefly for observation of possible contractions. She was found to not be in active labor and patient was reassured. She was transferred back to behavioral health unit. This morning, pt states that she is feeling much better this morning. She was nervous because her baby was moving around a lot but is feeling better. She states that the racing thoughts are much improved. She denies having any AH. She adamantly denies SI or thoughts of self harm. Adamantly denies thoughts of hurting the baby. She is able to verbalize her discharge plan very clearly. She is going to take a cab to get her car and then drive to [facility] in [place]. She has mental health appointment next week and will also be setting up OB appointments. She is nervous for this but also very excited for this next step. Her thoughts are much more organized and rational. She has bright affect. She does not appear manic or psychotic.   Associated Signs/Symptoms: Depression Symptoms:  Denies (Hypo) Manic Symptoms:  Denies Anxiety Symptoms:  Denies Psychotic Symptoms:  Improved PTSD Symptoms: Negative Total Time spent with patient: 30 minutes  Past Psychiatric History: See initial H&P on 06/03/17.  Is the patient at risk to self? No.  Has the patient been a risk to self in the past 6 months? No.  Has the patient been a risk to self within the distant past? No.  Is the patient a risk to others? No.  Has  the patient been a risk to others in the past 6 months? No.  Has the patient been a risk to others within the distant past? No.   Prior Inpatient Therapy:   Prior Outpatient Therapy:    Alcohol Screening:   Substance Abuse History in the last 12 months:  No. Consequences of Substance Abuse: Negative Previous Psychotropic Medications: Yes  Psychological Evaluations: Yes  Past Medical History:  Past Medical History:  Diagnosis Date  . Bipolar disorder (HCC)   . Schizophrenia, acute (HCC)    No past surgical history on file. Family History:  Family History  Problem Relation Age of Onset  . Diabetes Maternal Grandmother   . Diabetes Maternal Grandfather   . Cancer Maternal Grandfather    Family Psychiatric  History: See Initial H&P Tobacco Screening:   Social History:  Social History   Substance and Sexual Activity  Alcohol Use Never  . Frequency: Never     Social History   Substance and Sexual Activity  Drug Use Never    Additional Social History:                           Allergies:   Allergies  Allergen Reactions  . Amoxicillin Itching  . Shellfish Allergy Hives  . Latex Rash   Lab Results: No results found for this or any previous visit (from the past 48 hour(s)).  Blood Alcohol level:  Lab Results  Component Value Date   ETH <10 06/03/2017    Metabolic Disorder Labs:  Lab Results  Component Value Date  HGBA1C 5.7 (H) 06/04/2017   MPG 116.89 06/04/2017   No results found for: PROLACTIN Lab Results  Component Value Date   CHOL 236 (H) 06/04/2017   TRIG 130 06/04/2017   HDL 61 06/04/2017   CHOLHDL 3.9 06/04/2017   VLDL 26 06/04/2017   LDLCALC 149 (H) 06/04/2017    Current Medications: Current Facility-Administered Medications  Medication Dose Route Frequency Provider Last Rate Last Dose  . lurasidone (LATUDA) tablet 60 mg  60 mg Oral Q breakfast Tylan Briguglio, Ileene Hutchinson, MD       PTA Medications: Medications Prior to Admission   Medication Sig Dispense Refill Last Dose  . [DISCONTINUED] Lurasidone HCl 60 MG TABS Take 1 tablet (60 mg total) by mouth daily with breakfast. 30 tablet 1   . [DISCONTINUED] Prenatal Vit-Fe Fumarate-FA (PRENATAL MULTIVITAMIN) TABS tablet Take 1 tablet by mouth daily at 12 noon. 30 tablet 1     Musculoskeletal: Strength & Muscle Tone: within normal limits Gait & Station: normal Patient leans: N/A  Psychiatric Specialty Exam: Physical Exam  ROS  There were no vitals taken for this visit.There is no height or weight on file to calculate BMI.  General Appearance: Casual  Eye Contact:  Good  Speech:  Clear and Coherent  Volume:  Normal  Mood:  Euthymic  Affect:  Appropriate  Thought Process:  Coherent and Goal Directed  Orientation:  Full (Time, Place, and Person)  Thought Content:  Logical  Suicidal Thoughts:  No  Homicidal Thoughts:  No  Memory:  Immediate;   Fair  Judgement:  Fair  Insight:  Fair  Psychomotor Activity:  Normal  Concentration:  Concentration: Fair  Recall:  [employer] of Knowledge:  Fair  Language:  Fair  Akathisia:  No      Assets:  Resilience  ADL's:  Intact  Cognition:  WNL  Sleep:  Number of Hours: 0.5    Treatment Plan Summary: 37 yo female readmitted from OB floor. Psychotic and manic symptoms have improved significantly. She is much more rational and organized in conversation. She will be discharging today.   Plan: Schizoaffective disorder -latuda 60 mg daily. She was given 7 day supply of this medication  Pregnancy -Prenatal   Dispo -Discharge today.   Observation Level/Precautions:  15 minute checks  Laboratory: Done  Psychotherapy:    Medications:    Consultations:    Discharge Concerns:    Estimated LOS:1 day  Other:       I certify that inpatient services furnished can reasonably be expected to improve the patient's condition.    Haskell Riling, MD 4/12/20199:43 AM

## 2017-06-10 NOTE — Progress Notes (Signed)
Recreation Therapy Notes  INPATIENT RECREATION TR PLAN  Patient Details Name: Jaime Washington MRN: [id number] DOB: 11/03/1980 Today's Date: 06/10/2017  Rec Therapy Plan Is patient appropriate for Therapeutic Recreation?: Yes Treatment times per week: At least 3 Estimated Length of Stay: 5-7 days TR Treatment/Interventions: Group participation (Comment)  Discharge Criteria Pt will be discharged from therapy if:: Discharged Treatment plan/goals/alternatives discussed and agreed upon by:: Patient/family  Discharge Summary Short term goals set: Patient will identify 3 benefits of managing their anxiety in a healthy manner within 5 recreation therapy group sessions. Short term goals met: Adequate for discharge Progress toward goals comments: Groups attended Which groups?: Goal setting(Creative expression, self-care) Reason goals not met: N/A Therapeutic equipment acquired: N/A Reason patient discharged from therapy: Discharge from hospital Pt/family agrees with progress & goals achieved: Yes Date patient discharged from therapy: 06/10/17   Graeme Menees 06/10/2017, 12:43 PM

## 2017-06-10 NOTE — Tx Team (Signed)
Initial Treatment Plan 06/10/2017 10:46 AM Jaime Washington ZOX:[id number]    PATIENT STRESSORS: Marital or family conflict   PATIENT STRENGTHS: Ability for insight Active sense of humor Communication skills Motivation for treatment/growth   PATIENT IDENTIFIED PROBLEMS: Financial difficulties  Developing coping skills                   DISCHARGE CRITERIA:  Ability to meet basic life and health needs Adequate post-discharge living arrangements  PRELIMINARY DISCHARGE PLAN: Outpatient therapy Placement in alternative living arrangements  PATIENT/FAMILY INVOLVEMENT: This treatment plan has been presented to and reviewed with the patient, Jaime Washington, and/or family member.  The patient and family have been given the opportunity to ask questions and make suggestions.  [name], RN 06/10/2017, 10:46 AM

## 2017-06-10 NOTE — Progress Notes (Signed)
Patient transferred to [facility] at 0105 accompany with nurse tech Annice PihJackie and [employer] Mario. Patient placed on external fetal monitors; applied and assessing. BP 118/66. Temp 98.46F. Resp 18. Patient reports to increase fetal movement all day with increase urination. Patient states that since this evening contractions started and rating pain a 8 out of 10. Denies vaginal bleeding. Denies LOF. Jaime Washington. Jones, CNM notified of patient and report given at [id number]. Will continue to assess and monitor.

## 2017-10-13 ENCOUNTER — Encounter (HOSPITAL_COMMUNITY): Payer: Self-pay

## 2018-11-11 IMAGING — US US OB LIMITED
1 series · 14 of 22 positions shown · non-contrast
Comparison: none

CLINICAL DATA: Unknown last menstrual period. Pregnant patient.
Evaluate for gestational age and presentation.

EXAM:
LIMITED OBSTETRIC ULTRASOUND

[Series 1: us ob limited · 14 of 22 slices shown]
[im 1/22]
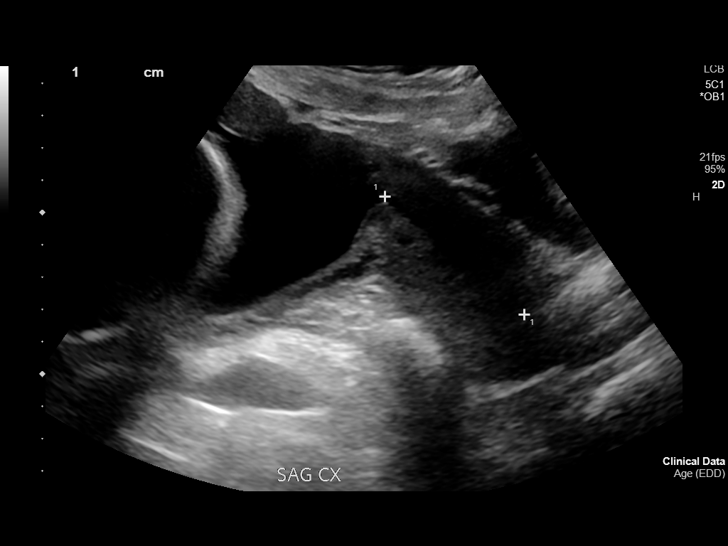
[im 3/22]
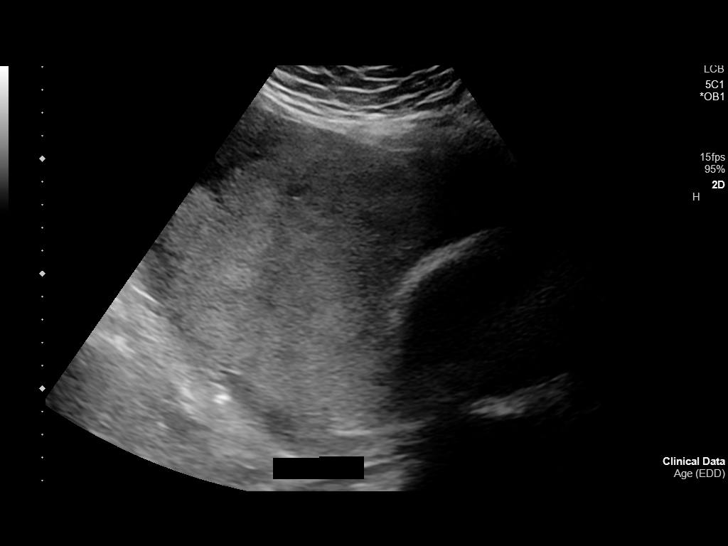
[im 4/22]
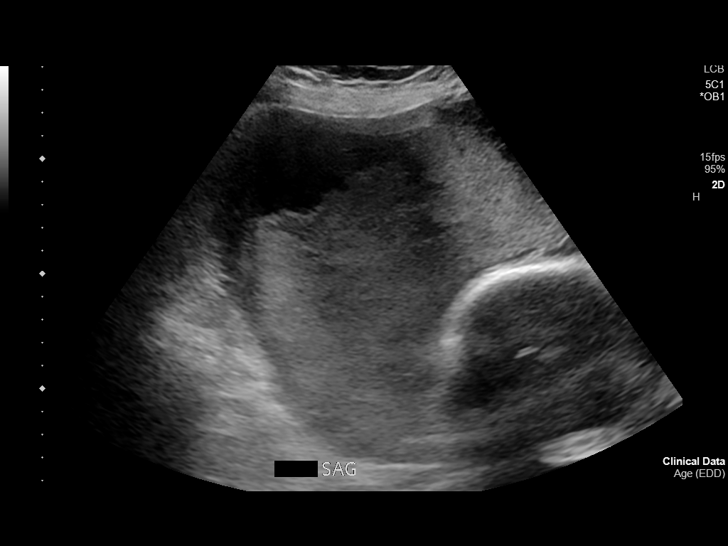
[im 6/22]
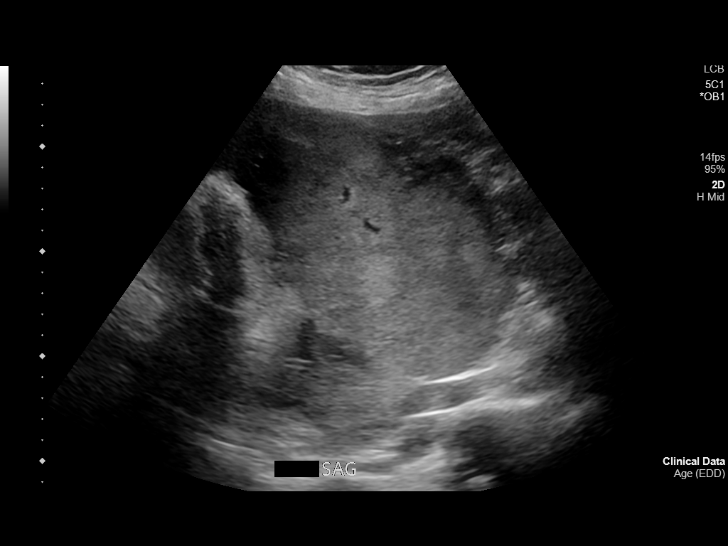
[im 8/22]
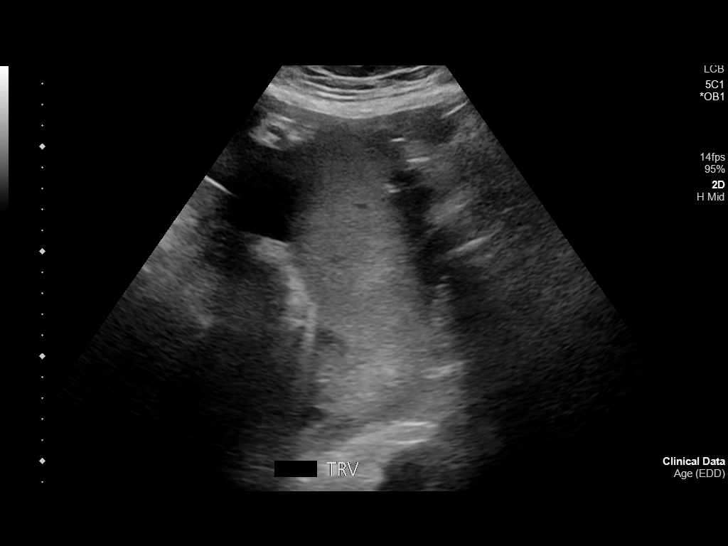
[im 9/22]
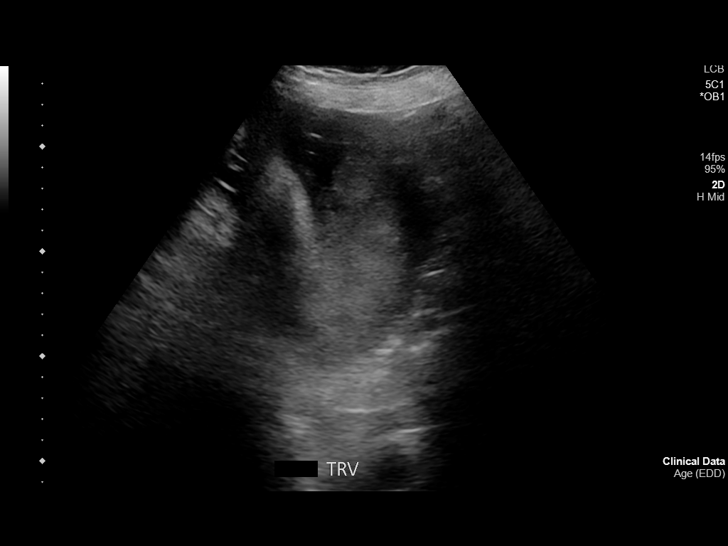
[im 11/22]
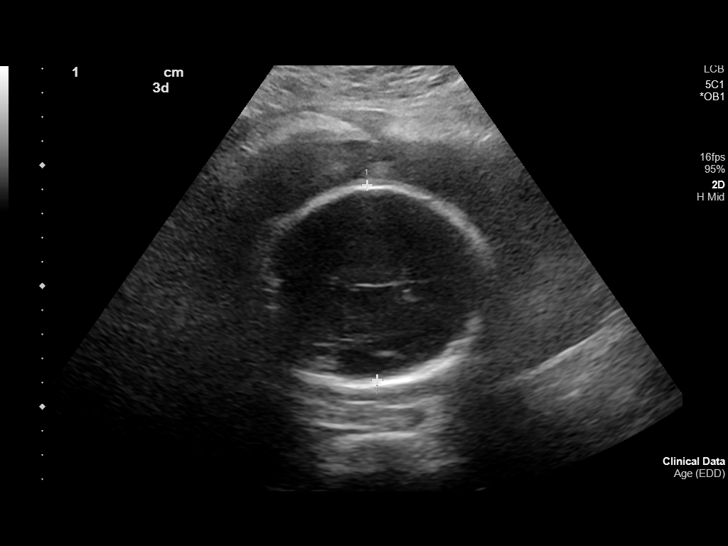
[im 12/22]
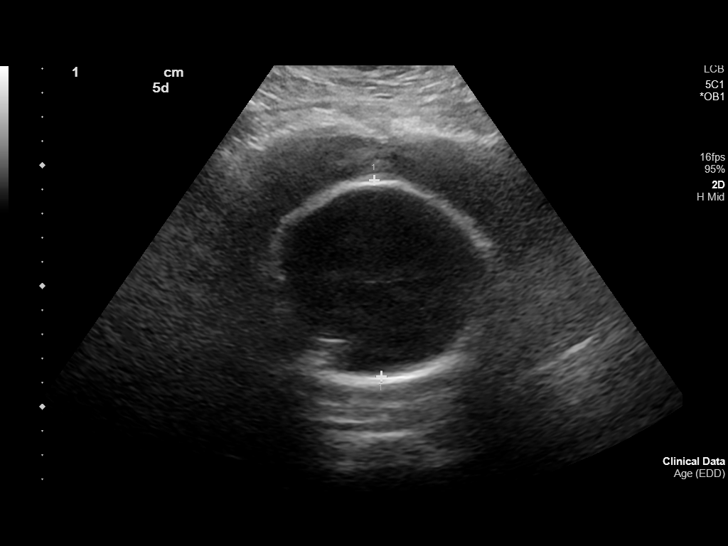
[im 14/22]
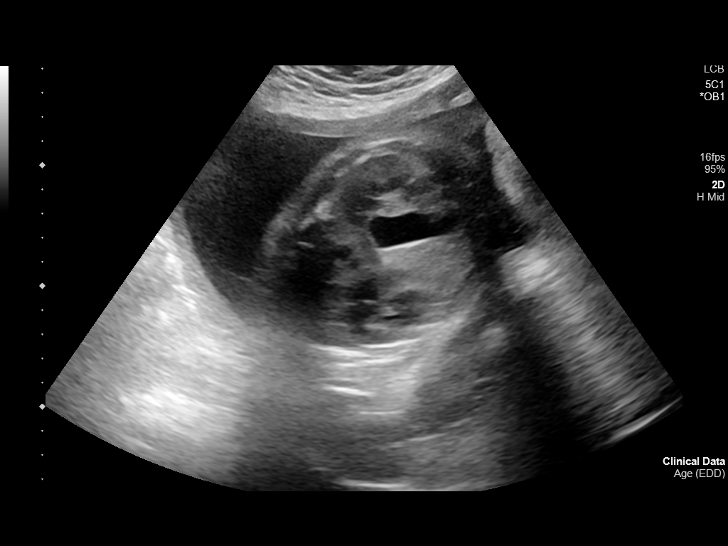
[im 15/22]
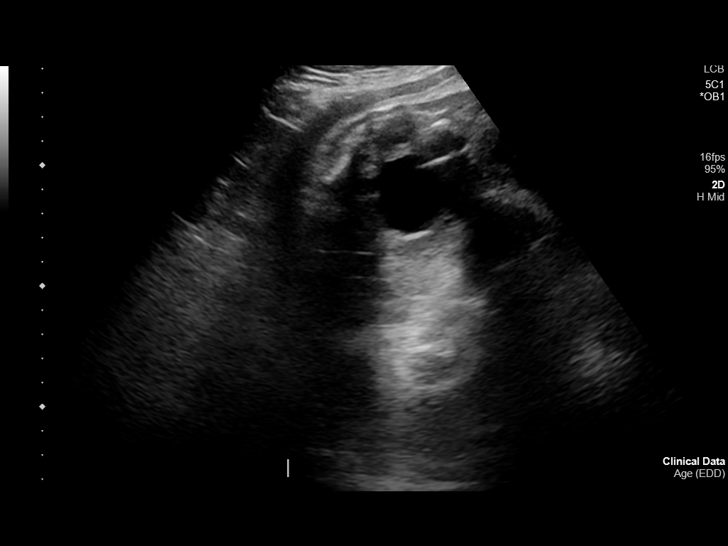
[im 17/22]
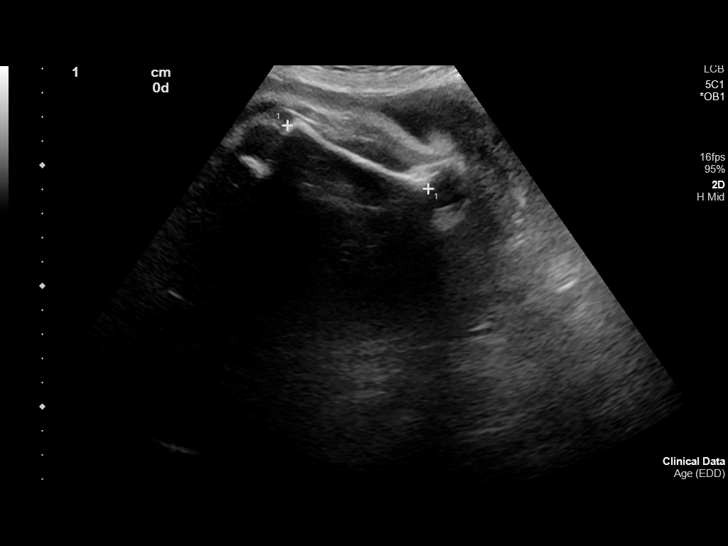
[im 19/22]
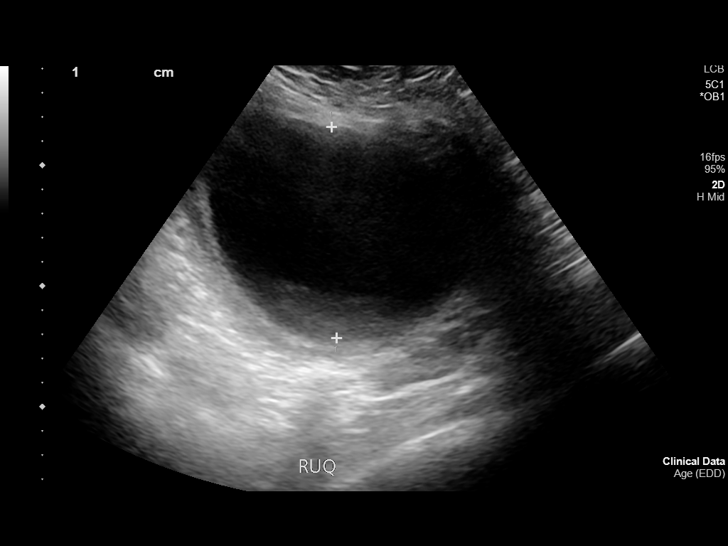
[im 20/22]
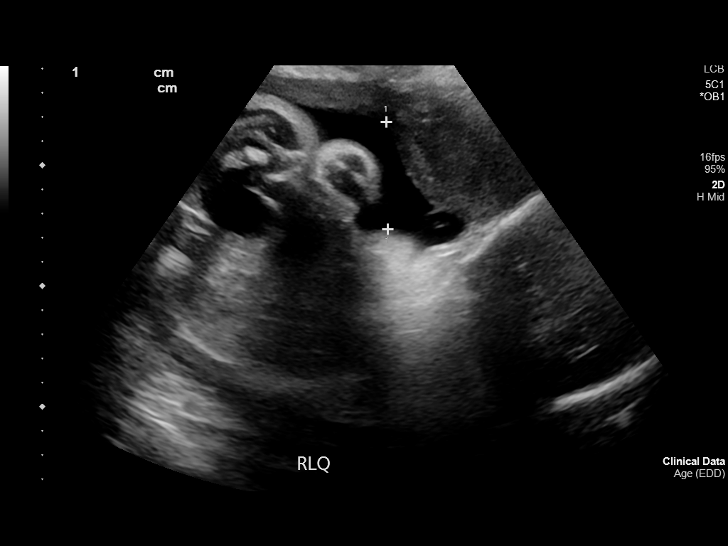
[im 22/22]
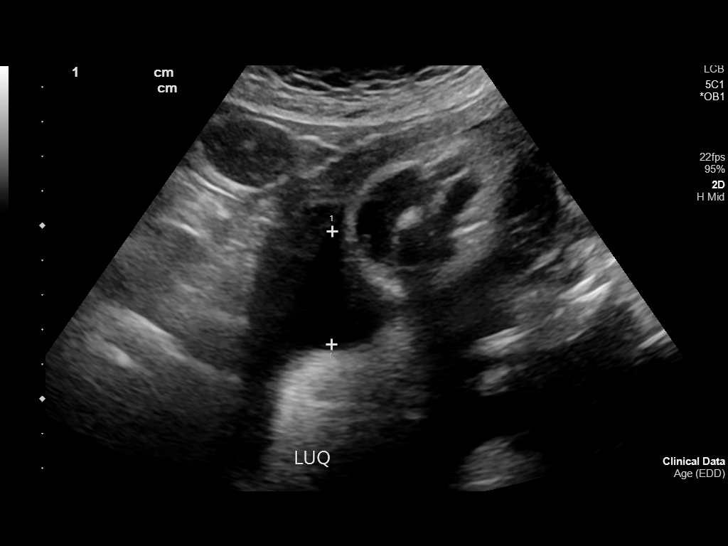

[14 of 22 positions shown; findings below may reference images not displayed]

FINDINGS: Number of Fetuses: 1

Heart Rate:  131 bpm

Movement: Yes

Presentation: Cephalic

Placental Location: Posterior and lateral on the left.

Previa: No.

Amniotic Fluid (Subjective):  Within normal limits.  AFI: 20.7 cm

BPD: 8.1 cm 32 w 4 d. femur length measurement medial is the same
gestational age.

MATERNAL FINDINGS:

Cervix:  Closed measuring 5.6 cm.

Uterus/Adnexae: No abnormality visualized.
IMPRESSION: 1. Single live intrauterine pregnancy with a measured gestational
age of 32 weeks and 4 days.
2. No pregnancy complication.  Normal amniotic fluid.

This exam is performed on an emergent basis and does not
comprehensively evaluate fetal size, dating, or anatomy; follow-up
complete OB US should be considered if further fetal assessment is
warranted.
